# Patient Record
Sex: Female | Born: 1960 | Race: White | Hispanic: Yes | Marital: Married | Smoking: Never smoker
Health system: Southern US, Community
[De-identification: ages and names within clinical notes are randomized; demographics above are authoritative.]

## PROBLEM LIST (undated history)

## (undated) DIAGNOSIS — E785 Hyperlipidemia, unspecified: Secondary | ICD-10-CM

## (undated) HISTORY — PX: TUBAL LIGATION: SHX77

## (undated) HISTORY — DX: Hyperlipidemia, unspecified: E78.5

---

## 2003-01-05 ENCOUNTER — Ambulatory Visit (HOSPITAL_COMMUNITY): Admission: RE | Admit: 2003-01-05 | Discharge: 2003-01-05 | Payer: Self-pay | Admitting: Obstetrics & Gynecology

## 2003-01-05 ENCOUNTER — Encounter: Payer: Self-pay | Admitting: Obstetrics & Gynecology

## 2004-02-11 ENCOUNTER — Ambulatory Visit (HOSPITAL_COMMUNITY): Admission: RE | Admit: 2004-02-11 | Discharge: 2004-02-11 | Payer: Self-pay | Admitting: Obstetrics & Gynecology

## 2005-02-16 ENCOUNTER — Ambulatory Visit (HOSPITAL_COMMUNITY): Admission: RE | Admit: 2005-02-16 | Discharge: 2005-02-16 | Payer: Self-pay | Admitting: Family Medicine

## 2005-03-09 ENCOUNTER — Ambulatory Visit (HOSPITAL_COMMUNITY): Admission: RE | Admit: 2005-03-09 | Discharge: 2005-03-09 | Payer: Self-pay | Admitting: Obstetrics

## 2006-02-19 ENCOUNTER — Ambulatory Visit (HOSPITAL_COMMUNITY): Admission: RE | Admit: 2006-02-19 | Discharge: 2006-02-19 | Payer: Self-pay | Admitting: Family Medicine

## 2007-04-30 ENCOUNTER — Ambulatory Visit (HOSPITAL_COMMUNITY): Admission: RE | Admit: 2007-04-30 | Discharge: 2007-04-30 | Payer: Self-pay | Admitting: Family Medicine

## 2008-05-05 ENCOUNTER — Ambulatory Visit (HOSPITAL_COMMUNITY): Admission: RE | Admit: 2008-05-05 | Discharge: 2008-05-05 | Payer: Self-pay | Admitting: Family Medicine

## 2008-06-04 ENCOUNTER — Ambulatory Visit: Payer: Self-pay

## 2009-05-30 ENCOUNTER — Ambulatory Visit (HOSPITAL_COMMUNITY): Admission: RE | Admit: 2009-05-30 | Discharge: 2009-05-30 | Payer: Self-pay | Admitting: Family Medicine

## 2010-05-31 ENCOUNTER — Ambulatory Visit (HOSPITAL_COMMUNITY)
Admission: RE | Admit: 2010-05-31 | Discharge: 2010-05-31 | Payer: Self-pay | Source: Home / Self Care | Attending: Family Medicine | Admitting: Family Medicine

## 2010-07-09 ENCOUNTER — Encounter: Payer: Self-pay | Admitting: Family Medicine

## 2011-05-29 ENCOUNTER — Other Ambulatory Visit (HOSPITAL_COMMUNITY): Payer: Self-pay | Admitting: Family Medicine

## 2011-05-29 DIAGNOSIS — Z1231 Encounter for screening mammogram for malignant neoplasm of breast: Secondary | ICD-10-CM

## 2011-07-03 ENCOUNTER — Ambulatory Visit (HOSPITAL_COMMUNITY)
Admission: RE | Admit: 2011-07-03 | Discharge: 2011-07-03 | Disposition: A | Discharge status: Home / Self Care | Source: Ambulatory Visit | Attending: Family Medicine | Admitting: Family Medicine

## 2011-07-03 DIAGNOSIS — Z1231 Encounter for screening mammogram for malignant neoplasm of breast: Secondary | ICD-10-CM | POA: Insufficient documentation

## 2011-07-10 ENCOUNTER — Other Ambulatory Visit: Payer: Self-pay | Admitting: Family Medicine

## 2011-07-10 DIAGNOSIS — R928 Other abnormal and inconclusive findings on diagnostic imaging of breast: Secondary | ICD-10-CM

## 2011-07-30 ENCOUNTER — Ambulatory Visit
Admission: RE | Admit: 2011-07-30 | Discharge: 2011-07-30 | Disposition: A | Discharge status: Home / Self Care | Source: Ambulatory Visit | Attending: Family Medicine | Admitting: Family Medicine

## 2011-07-30 DIAGNOSIS — R928 Other abnormal and inconclusive findings on diagnostic imaging of breast: Secondary | ICD-10-CM

## 2012-11-27 ENCOUNTER — Other Ambulatory Visit (HOSPITAL_COMMUNITY): Payer: Self-pay | Admitting: Family Medicine

## 2012-11-27 DIAGNOSIS — Z1231 Encounter for screening mammogram for malignant neoplasm of breast: Secondary | ICD-10-CM

## 2012-12-05 ENCOUNTER — Ambulatory Visit (HOSPITAL_COMMUNITY)
Admission: RE | Admit: 2012-12-05 | Discharge: 2012-12-05 | Disposition: A | Discharge status: Home / Self Care | Source: Ambulatory Visit | Attending: Family Medicine | Admitting: Family Medicine

## 2012-12-05 DIAGNOSIS — Z1231 Encounter for screening mammogram for malignant neoplasm of breast: Secondary | ICD-10-CM | POA: Insufficient documentation

## 2013-08-04 ENCOUNTER — Ambulatory Visit: Payer: Self-pay | Admitting: Obstetrics

## 2013-08-27 ENCOUNTER — Ambulatory Visit (INDEPENDENT_AMBULATORY_CARE_PROVIDER_SITE_OTHER): Admitting: Obstetrics

## 2013-08-27 ENCOUNTER — Encounter: Payer: Self-pay | Admitting: Obstetrics

## 2013-08-27 VITALS — BP 107/72 | HR 78 | Temp 98.5°F | Ht 61.0 in | Wt 149.0 lb

## 2013-08-27 DIAGNOSIS — Z01419 Encounter for gynecological examination (general) (routine) without abnormal findings: Secondary | ICD-10-CM

## 2013-08-27 DIAGNOSIS — Z124 Encounter for screening for malignant neoplasm of cervix: Secondary | ICD-10-CM

## 2013-08-27 DIAGNOSIS — N951 Menopausal and female climacteric states: Secondary | ICD-10-CM

## 2013-08-27 DIAGNOSIS — Z113 Encounter for screening for infections with a predominantly sexual mode of transmission: Secondary | ICD-10-CM

## 2013-08-27 NOTE — Progress Notes (Signed)
Subjective:     Melody Berger is a 53 y.o. female here for a routine exam.  Current complaints:Patient is in the office today for an annual exam. Patient states her last cycle was in December, Patient states it only lasted for 3 days, that the first day it was really heavy and then just drips then next 2 days. Patient states she thinks she is going through menopause. Patient states that each month she has breast tenderness. Patient states sometimes she cant sleep and that she gets headaches.   Personal health questionnaire reviewed: yes.   Gynecologic History Patient's last menstrual period was 05/22/2013. Contraception: none Last Pap: 2013. Results were: normal Last mammogram: 2014. Results were: normal ( Patient states they told her she has thick tissue)  Obstetric History OB History  No data available     The following portions of the patient's history were reviewed and updated as appropriate: allergies, current medications, past family history, past medical history, past social history, past surgical history and problem list.  Review of Systems Pertinent items are noted in HPI.    Objective:    General appearance: alert and no distress Breasts: normal appearance, no masses or tenderness Abdomen: normal findings: soft, non-tender Pelvic: cervix normal in appearance, external genitalia normal, no adnexal masses or tenderness, no cervical motion tenderness, rectovaginal septum normal, uterus normal size, shape, and consistency and vagina normal without discharge    Assessment:    Healthy female exam.   Perimenopause   Plan:    Education reviewed: Perimenopause. Follow up in: 1 year.

## 2013-08-28 LAB — WET PREP BY MOLECULAR PROBE
Candida species: NEGATIVE
Gardnerella vaginalis: NEGATIVE
Trichomonas vaginosis: NEGATIVE

## 2013-08-28 LAB — PAP IG W/ RFLX HPV ASCU

## 2014-02-15 ENCOUNTER — Other Ambulatory Visit (HOSPITAL_COMMUNITY): Payer: Self-pay | Admitting: Family Medicine

## 2014-02-15 DIAGNOSIS — Z1231 Encounter for screening mammogram for malignant neoplasm of breast: Secondary | ICD-10-CM

## 2014-02-18 ENCOUNTER — Ambulatory Visit (HOSPITAL_COMMUNITY)
Admission: RE | Admit: 2014-02-18 | Discharge: 2014-02-18 | Disposition: A | Discharge status: Home / Self Care | Source: Ambulatory Visit | Attending: Family Medicine | Admitting: Family Medicine

## 2014-02-18 DIAGNOSIS — Z1231 Encounter for screening mammogram for malignant neoplasm of breast: Secondary | ICD-10-CM | POA: Insufficient documentation

## 2014-04-19 ENCOUNTER — Encounter: Payer: Self-pay | Admitting: Obstetrics

## 2015-03-04 ENCOUNTER — Other Ambulatory Visit (HOSPITAL_COMMUNITY): Payer: Self-pay | Admitting: Internal Medicine

## 2015-03-04 DIAGNOSIS — Z1231 Encounter for screening mammogram for malignant neoplasm of breast: Secondary | ICD-10-CM

## 2015-03-09 ENCOUNTER — Ambulatory Visit (HOSPITAL_COMMUNITY)
Admission: RE | Admit: 2015-03-09 | Discharge: 2015-03-09 | Disposition: A | Discharge status: Home / Self Care | Source: Ambulatory Visit | Attending: Internal Medicine | Admitting: Internal Medicine

## 2015-03-09 DIAGNOSIS — Z1231 Encounter for screening mammogram for malignant neoplasm of breast: Secondary | ICD-10-CM | POA: Diagnosis not present

## 2015-03-17 IMAGING — MG MM DIGITAL SCREENING BILAT W/ CAD
4 series · 4 of 4 positions shown · non-contrast
Comparison: Previous exams.

CLINICAL DATA: Screening.

DIGITAL SCREENING BILATERAL MAMMOGRAM WITH CAD

[R CC]
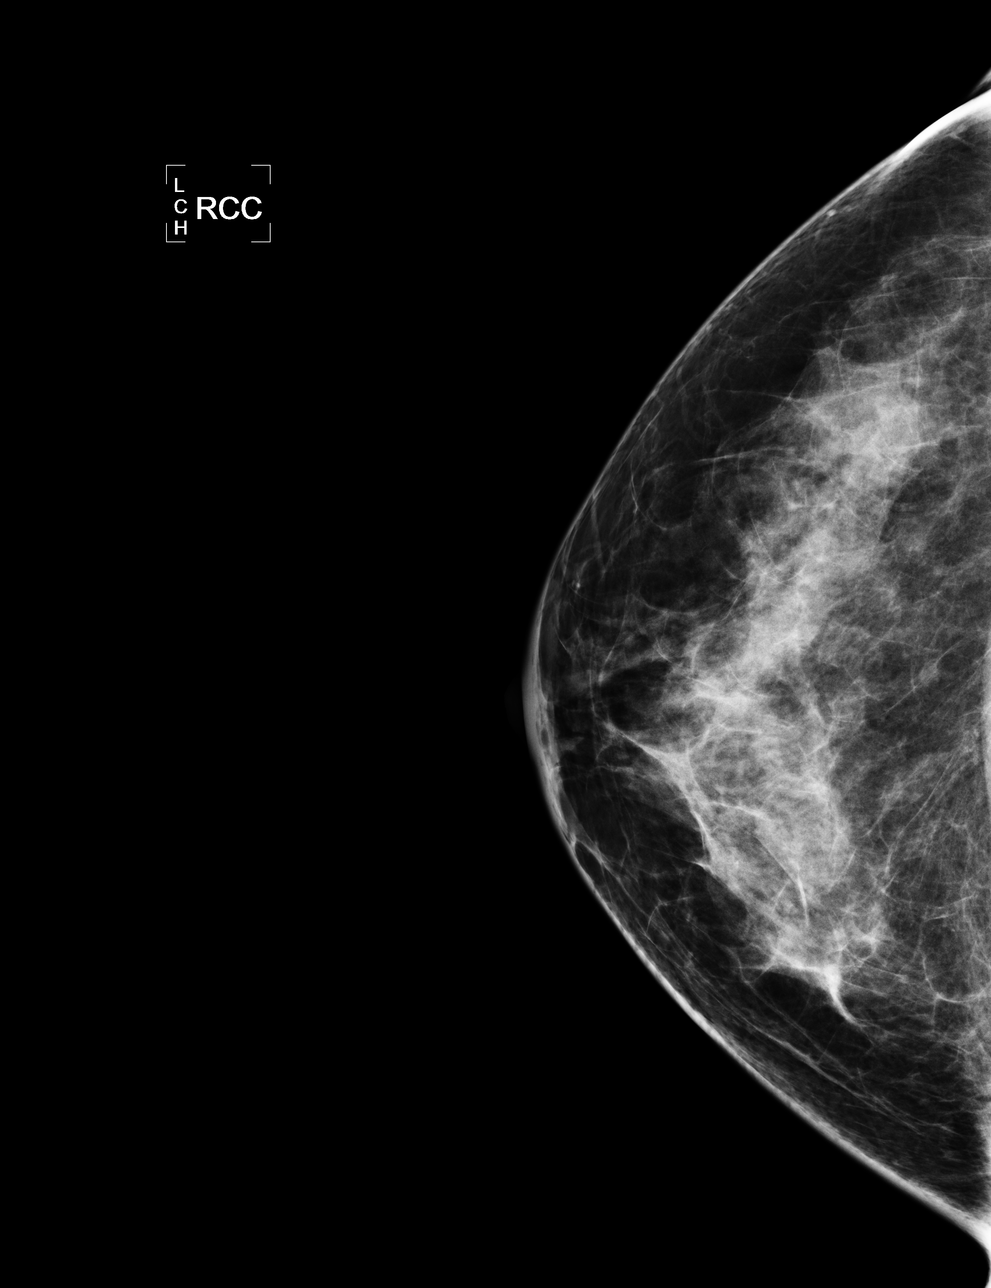

[R MLO]
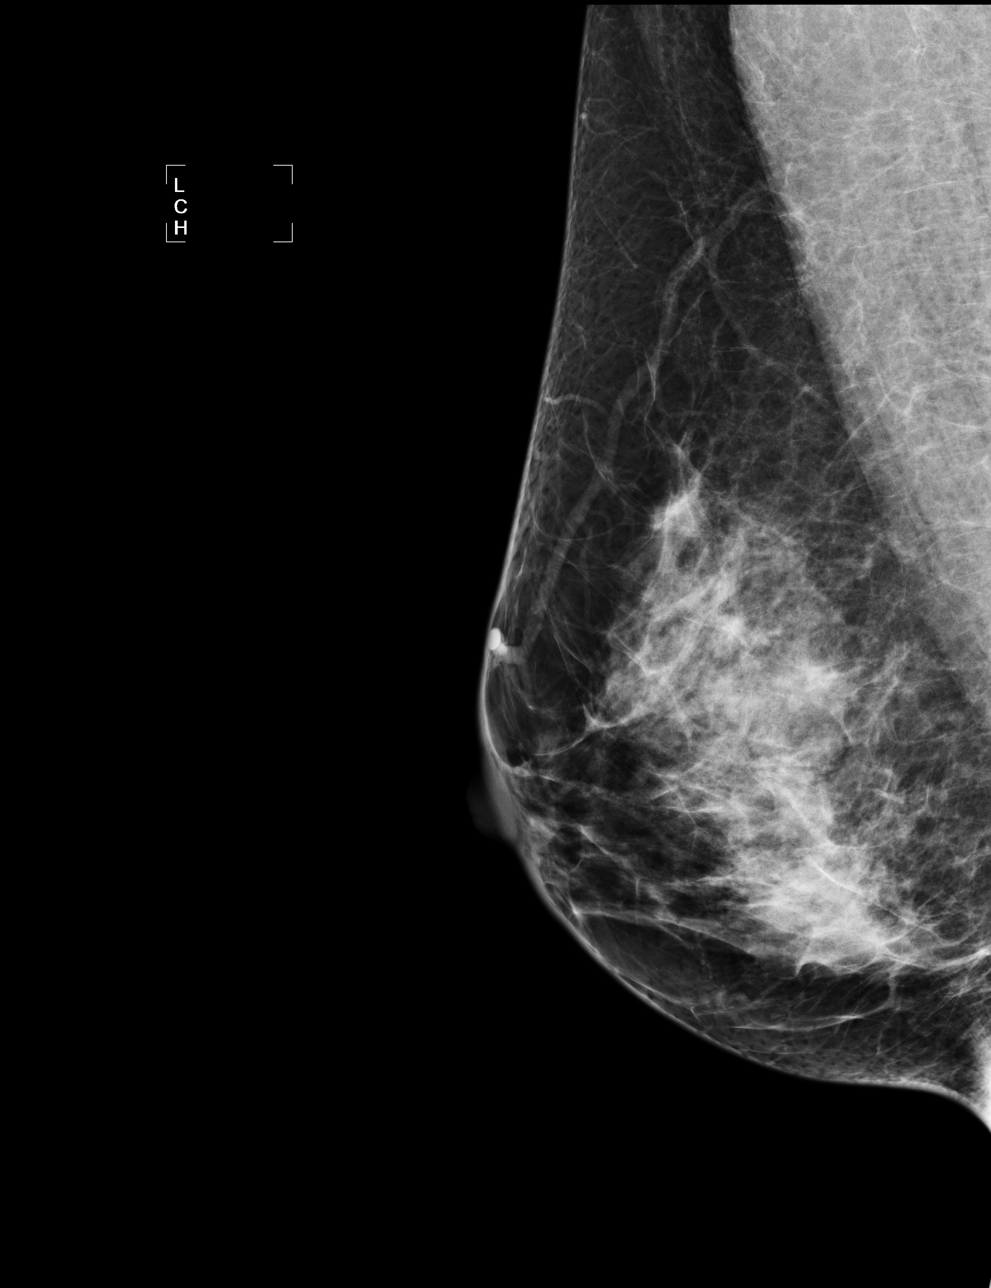

[L CC]
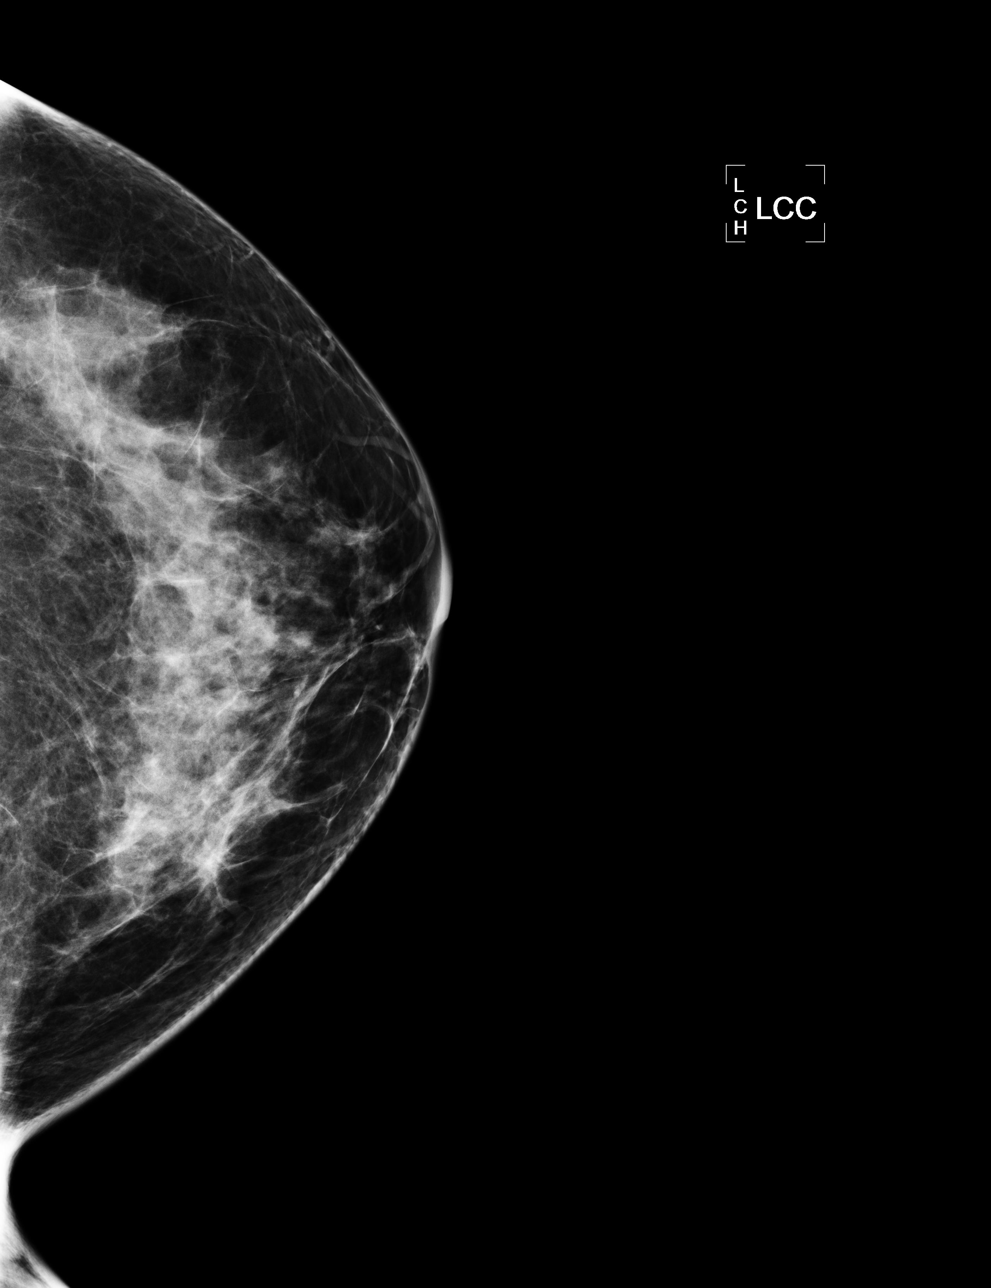

[L MLO]
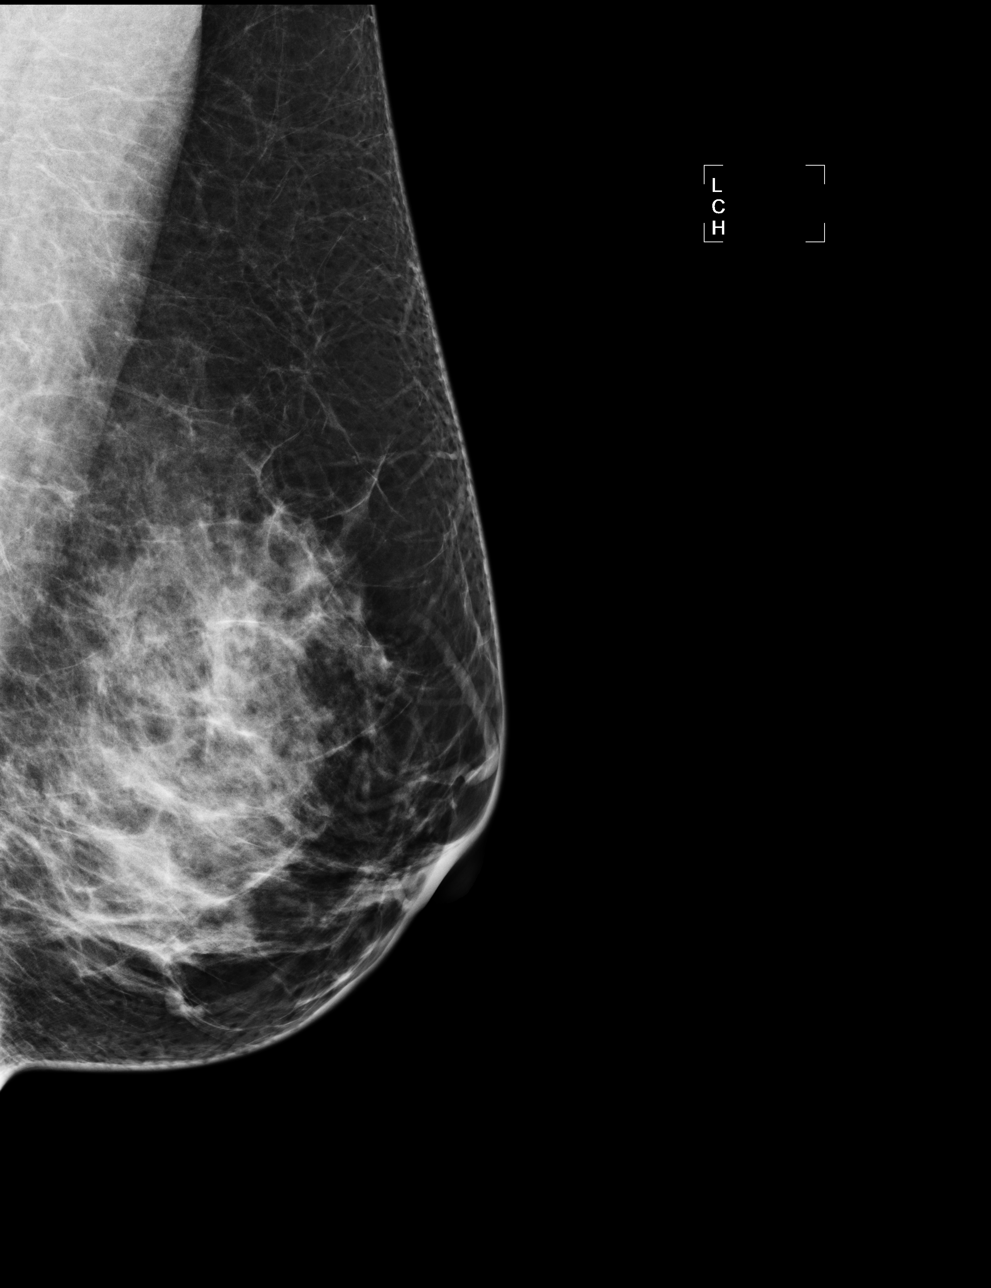

[4 of 4 positions shown; findings below may reference images not displayed]

FINDINGS: ACR Breast Density Category c: The breast tissue is heterogeneously
dense.

There are no findings suspicious for malignancy.

Images were processed with CAD.
IMPRESSION: No mammographic evidence of malignancy.

A result letter of this screening mammogram will be mailed directly
to the patient.

RECOMMENDATION:
Screening mammogram in one year. (Code:BI-I-WJL)

BI-RADS CATEGORY 1:  Negative.

## 2016-03-15 ENCOUNTER — Other Ambulatory Visit: Payer: Self-pay | Admitting: Internal Medicine

## 2016-03-15 DIAGNOSIS — Z1231 Encounter for screening mammogram for malignant neoplasm of breast: Secondary | ICD-10-CM

## 2016-03-22 ENCOUNTER — Other Ambulatory Visit: Payer: Self-pay | Admitting: Internal Medicine

## 2016-03-22 DIAGNOSIS — M8589 Other specified disorders of bone density and structure, multiple sites: Secondary | ICD-10-CM

## 2016-03-27 ENCOUNTER — Ambulatory Visit
Admission: RE | Admit: 2016-03-27 | Discharge: 2016-03-27 | Disposition: A | Discharge status: Home / Self Care | Source: Ambulatory Visit | Attending: Internal Medicine | Admitting: Internal Medicine

## 2016-03-27 DIAGNOSIS — Z1231 Encounter for screening mammogram for malignant neoplasm of breast: Secondary | ICD-10-CM

## 2016-04-06 ENCOUNTER — Ambulatory Visit
Admission: RE | Admit: 2016-04-06 | Discharge: 2016-04-06 | Disposition: A | Discharge status: Home / Self Care | Source: Ambulatory Visit | Attending: Internal Medicine | Admitting: Internal Medicine

## 2016-04-06 DIAGNOSIS — M8589 Other specified disorders of bone density and structure, multiple sites: Secondary | ICD-10-CM

## 2017-08-28 ENCOUNTER — Other Ambulatory Visit: Payer: Self-pay | Admitting: Medical

## 2017-08-28 DIAGNOSIS — N644 Mastodynia: Secondary | ICD-10-CM

## 2017-09-06 ENCOUNTER — Ambulatory Visit
Admission: RE | Admit: 2017-09-06 | Discharge: 2017-09-06 | Disposition: A | Discharge status: Home / Self Care | Source: Ambulatory Visit | Attending: Medical | Admitting: Medical

## 2017-09-06 ENCOUNTER — Ambulatory Visit

## 2017-09-06 DIAGNOSIS — N644 Mastodynia: Secondary | ICD-10-CM

## 2018-08-19 ENCOUNTER — Other Ambulatory Visit: Payer: Self-pay | Admitting: Medical

## 2018-08-19 DIAGNOSIS — Z1231 Encounter for screening mammogram for malignant neoplasm of breast: Secondary | ICD-10-CM

## 2018-08-29 ENCOUNTER — Ambulatory Visit: Admitting: Cardiology

## 2018-09-19 ENCOUNTER — Ambulatory Visit

## 2018-11-07 ENCOUNTER — Ambulatory Visit
Admission: RE | Admit: 2018-11-07 | Discharge: 2018-11-07 | Disposition: A | Discharge status: Home / Self Care | Source: Ambulatory Visit | Attending: Medical | Admitting: Medical

## 2018-11-07 ENCOUNTER — Other Ambulatory Visit: Payer: Self-pay

## 2018-11-07 DIAGNOSIS — Z1231 Encounter for screening mammogram for malignant neoplasm of breast: Secondary | ICD-10-CM

## 2019-08-12 ENCOUNTER — Encounter: Payer: Self-pay | Admitting: Physician Assistant

## 2019-08-12 ENCOUNTER — Ambulatory Visit (INDEPENDENT_AMBULATORY_CARE_PROVIDER_SITE_OTHER): Admitting: Physician Assistant

## 2019-08-12 ENCOUNTER — Other Ambulatory Visit: Payer: Self-pay

## 2019-08-12 VITALS — BP 122/80 | HR 62 | Temp 98.0°F | Resp 16 | Ht 61.0 in | Wt 145.0 lb

## 2019-08-12 DIAGNOSIS — Z23 Encounter for immunization: Secondary | ICD-10-CM | POA: Diagnosis not present

## 2019-08-12 DIAGNOSIS — E782 Mixed hyperlipidemia: Secondary | ICD-10-CM | POA: Insufficient documentation

## 2019-08-12 MED ORDER — TETANUS-DIPHTH-ACELL PERTUSSIS 5-2.5-18.5 LF-MCG/0.5 IM SUSP: 0.5000 mL | Freq: Once | INTRAMUSCULAR | 0 refills | Status: DC

## 2019-08-12 NOTE — Assessment & Plan Note (Signed)
Continue current meds as directed labwork pending 

## 2019-08-12 NOTE — Progress Notes (Signed)
Acute Office Visit  Subjective:    Patient ID: Melody Berger, female    DOB: 1961/03/25, 59 y.o.   MRN: [id number]  Chief Complaint  Patient presents with  . Follow-up  . Hyperlipidemia    HPI Patient is in today for follow up hyperlipidemia - she is currently on rosuvastatin 5mg  qd and fish oil qd - she tries to watch her diet Mixed hyperlipidemia  Pt presents with hyperlipidemia.  Compliance with treatment has been good ; The patient is compliant with medications, maintains a low cholesterol diet , follows up as directed , and maintains an exercise regimen . The patient denies experiencing any hypercholesterolemia related symptoms.  Evidenced based information based on history , exam, and other sources has been used  for decision making.  Past Medical History:  Diagnosis Date  . Hyperlipemia     Past Surgical History:  Procedure Laterality Date  . TUBAL LIGATION      Family History  Problem Relation Age of Onset  . Osteoporosis Mother   . Cancer Father   . Breast cancer Cousin        in 35's or 52's    Social History   Socioeconomic History  . Marital status: Married    Spouse name: Not on file  . Number of children: Not on file  . Years of education: Not on file  . Highest education level: Not on file  Occupational History  . Occupation: unemployed  Tobacco Use  . Smoking status: Never Smoker  . Smokeless tobacco: Never Used  Substance and Sexual Activity  . Alcohol use: No  . Drug use: No  . Sexual activity: Not Currently    Birth control/protection: Surgical    Comment: Tubal Ligation   Other Topics Concern  . Not on file  Social History Narrative  . Not on file   Social Determinants of Health   Financial Resource Strain:   . Difficulty of Paying Living Expenses: Not on file  Food Insecurity:   . Worried About 59's in the Last Year: Not on file  . Ran Out of Food in the Last Year: Not on file  Transportation Needs:   . Lack of  Transportation (Medical): Not on file  . Lack of Transportation (Non-Medical): Not on file  Physical Activity:   . Days of Exercise per Week: Not on file  . Minutes of Exercise per Session: Not on file  Stress:   . Feeling of Stress : Not on file  Social Connections:   . Frequency of Communication with Friends and Family: Not on file  . Frequency of Social Gatherings with Friends and Family: Not on file  . Attends Religious Services: Not on file  . Active Member of Clubs or Organizations: Not on file  . Attends Programme researcher, broadcasting/film/video Meetings: Not on file  . Marital Status: Not on file  Intimate Partner Violence:   . Fear of Current or Ex-Partner: Not on file  . Emotionally Abused: Not on file  . Physically Abused: Not on file  . Sexually Abused: Not on file     Current Outpatient Medications:  .  cetirizine (ZYRTEC) 10 MG tablet, Take 10 mg by mouth daily., Disp: , Rfl:  .  Multiple Vitamins-Minerals (MULTIVITAMIN PO), Take by mouth., Disp: , Rfl:  .  rosuvastatin (CRESTOR) 5 MG tablet, Take 5 mg by mouth daily., Disp: , Rfl:    Allergies  Allergen Reactions  . Terbinafine And Related  CONSTITUTIONAL: Negative for chills, fatigue, fever, unintentional weight gain and unintentional weight loss.  E/N/T: Negative for ear pain, nasal congestion and sore throat.  CARDIOVASCULAR: Negative for chest pain, dizziness, palpitations and pedal edema.  RESPIRATORY: Negative for recent cough and dyspnea.  GASTROINTESTINAL: Negative for abdominal pain, acid reflux symptoms, constipation, diarrhea, nausea and vomiting.  MSK: Negative for arthralgias and myalgias.  INTEGUMENTARY: Negative for rash.  NEUROLOGICAL: Negative for dizziness and headaches.  PSYCHIATRIC: Negative for sleep disturbance and to question depression screen.  Negative for depression, negative for anhedonia.         Objective:    PHYSICAL EXAM:   VS: BP 122/80   Pulse 62   Temp 98 F (36.7 C)   Resp 16   Ht  5\' 1"  (1.549 m)   Wt 145 lb (65.8 kg)   LMP 05/22/2013   SpO2 99%   BMI 27.40 kg/m   GEN: Well nourished, well developed, in no acute distress   Cardiac: RRR; no murmurs, rubs, or gallops,no edema - no significant varicosities Respiratory:  normal respiratory rate and pattern with no distress - normal breath sounds with no rales, rhonchi, wheezes or rubs  Neuro:  Alert and Oriented x 3, Strength and sensation are intact - CN II-Xii grossly intact Psych: euthymic mood, appropriate affect and demeanor   Wt Readings from Last 3 Encounters:  08/12/19 145 lb (65.8 kg)  08/27/13 149 lb (67.6 kg)    Health Maintenance Due  Topic Date Due  . Hepatitis C Screening  1961/02/25  . HIV Screening  01/28/1976  . PAP SMEAR-Modifier  08/27/2016    There are no preventive care reminders to display for this patient.        Assessment & Plan:   Problem List Items Addressed This Visit      Other   Mixed hyperlipidemia - Primary    Continue current meds as directed labwork pending      Relevant Medications   rosuvastatin (CRESTOR) 5 MG tablet   Other Relevant Orders   CBC with Differential/Platelet   Comprehensive metabolic panel   Lipid panel   Need for prophylactic vaccination with combined diphtheria-tetanus-pertussis (DTP) vaccine   Relevant Orders   Tdap vaccine greater than or equal to 7yo IM (Completed)       Meds ordered this encounter  Medications  . DISCONTD: Tdap (BOOSTRIX) 5-2.5-18.5 LF-MCG/0.5 injection    Sig: Inject 0.5 mLs into the muscle once for 1 dose.    Dispense:  0.5 mL    Refill:  0    Order Specific Question:   Supervising Provider    Answer:   COX, KIRSTEN [[id number]]     [place], PA-C

## 2019-08-13 LAB — LIPID PANEL
Chol/HDL Ratio: 3.3 ratio (ref 0.0–4.4)
Cholesterol, Total: 176 mg/dL (ref 100–199)
HDL: 54 mg/dL (ref >39)
LDL Chol Calc (NIH): 98 mg/dL (ref 0–99)
Triglycerides: 136 mg/dL (ref 0–149)
VLDL Cholesterol Cal: 24 mg/dL (ref 5–40)

## 2019-08-13 LAB — CBC WITH DIFFERENTIAL/PLATELET
Basophils Absolute: 0 10*3/uL (ref 0.0–0.2)
Basos: 0 %
EOS (ABSOLUTE): 0.1 10*3/uL (ref 0.0–0.4)
Eos: 2 %
Hematocrit: 45.8 % (ref 34.0–46.6)
Hemoglobin: 15 g/dL (ref 11.1–15.9)
Immature Grans (Abs): 0 10*3/uL (ref 0.0–0.1)
Immature Granulocytes: 0 %
Lymphocytes Absolute: 1.9 10*3/uL (ref 0.7–3.1)
Lymphs: 33 %
MCH: 28.8 pg (ref 26.6–33.0)
MCHC: 32.8 g/dL (ref 31.5–35.7)
MCV: 88 fL (ref 79–97)
Monocytes Absolute: 0.4 10*3/uL (ref 0.1–0.9)
Monocytes: 7 %
Neutrophils Absolute: 3.3 10*3/uL (ref 1.4–7.0)
Neutrophils: 58 %
Platelets: 155 10*3/uL (ref 150–450)
RBC: 5.21 x10E6/uL (ref 3.77–5.28)
RDW: 12.8 % (ref 11.7–15.4)
WBC: 5.7 10*3/uL (ref 3.4–10.8)

## 2019-08-13 LAB — COMPREHENSIVE METABOLIC PANEL
ALT: 25 IU/L (ref 0–32)
AST: 23 IU/L (ref 0–40)
Albumin/Globulin Ratio: 1.6 (ref 1.2–2.2)
Albumin: 4.2 g/dL (ref 3.8–4.9)
Alkaline Phosphatase: 92 IU/L (ref 39–117)
BUN/Creatinine Ratio: 18 (ref 9–23)
BUN: 13 mg/dL (ref 6–24)
Bilirubin Total: 0.4 mg/dL (ref 0.0–1.2)
CO2: 24 mmol/L (ref 20–29)
Calcium: 9.3 mg/dL (ref 8.7–10.2)
Chloride: 106 mmol/L (ref 96–106)
Creatinine, Ser: 0.73 mg/dL (ref 0.57–1.00)
GFR calc Af Amer: 105 mL/min/{1.73_m2} (ref >59)
GFR calc non Af Amer: 91 mL/min/{1.73_m2} (ref >59)
Globulin, Total: 2.6 g/dL (ref 1.5–4.5)
Glucose: 85 mg/dL (ref 65–99)
Potassium: 4.5 mmol/L (ref 3.5–5.2)
Sodium: 144 mmol/L (ref 134–144)
Total Protein: 6.8 g/dL (ref 6.0–8.5)

## 2019-08-13 LAB — CARDIOVASCULAR RISK ASSESSMENT

## 2019-10-05 ENCOUNTER — Other Ambulatory Visit: Payer: Self-pay | Admitting: Family Medicine

## 2019-11-02 ENCOUNTER — Other Ambulatory Visit: Payer: Self-pay | Admitting: Physician Assistant

## 2019-11-02 DIAGNOSIS — Z1231 Encounter for screening mammogram for malignant neoplasm of breast: Secondary | ICD-10-CM

## 2019-11-19 ENCOUNTER — Ambulatory Visit

## 2019-11-19 ENCOUNTER — Ambulatory Visit
Admission: RE | Admit: 2019-11-19 | Discharge: 2019-11-19 | Disposition: A | Discharge status: Home / Self Care | Source: Ambulatory Visit | Attending: Physician Assistant | Admitting: Physician Assistant

## 2019-11-19 ENCOUNTER — Other Ambulatory Visit: Payer: Self-pay

## 2019-11-19 DIAGNOSIS — Z1231 Encounter for screening mammogram for malignant neoplasm of breast: Secondary | ICD-10-CM

## 2020-02-09 ENCOUNTER — Encounter: Payer: Self-pay | Admitting: Physician Assistant

## 2020-02-09 ENCOUNTER — Ambulatory Visit (INDEPENDENT_AMBULATORY_CARE_PROVIDER_SITE_OTHER): Admitting: Physician Assistant

## 2020-02-09 ENCOUNTER — Other Ambulatory Visit: Payer: Self-pay

## 2020-02-09 VITALS — BP 116/68 | HR 64 | Temp 97.8°F | Ht 61.0 in | Wt 146.0 lb

## 2020-02-09 DIAGNOSIS — M546 Pain in thoracic spine: Secondary | ICD-10-CM

## 2020-02-09 DIAGNOSIS — E782 Mixed hyperlipidemia: Secondary | ICD-10-CM | POA: Diagnosis not present

## 2020-02-09 MED ORDER — CYCLOBENZAPRINE HCL 5 MG PO TABS: 5.0000 mg | ORAL_TABLET | Freq: Every day | ORAL | 0 refills | Status: DC

## 2020-02-09 MED ORDER — ROSUVASTATIN CALCIUM 5 MG PO TABS: 5.0000 mg | ORAL_TABLET | Freq: Every day | ORAL | 1 refills | Status: DC

## 2020-02-09 NOTE — Assessment & Plan Note (Signed)
rom exercises Ice therapy Continue aleve - rx for flexeril

## 2020-02-09 NOTE — Patient Instructions (Signed)

## 2020-02-09 NOTE — Assessment & Plan Note (Signed)
Well controlled.  ?No changes to medicines.  ?Continue to work on eating a healthy diet and exercise.  ?Labs drawn today.  ?

## 2020-02-09 NOTE — Progress Notes (Signed)
Acute Office Visit  Subjective:    Patient ID: Melody Berger, female    DOB: 07/03/1960, 59 y.o.   MRN: [id number]  Chief Complaint  Patient presents with   Hyperlipidemia    6 months fasting    HPI Patient is in today for follow up hyperlipidemia - she is currently on rosuvastatin 5mg  qd and fish oil qd - she tries to watch her diet ; The patient is compliant with medications, maintains a low cholesterol diet , follows up as directed , and maintains an exercise regimen . The patient denies experiencing any hypercholesterolemia related symptoms.  Evidenced based information based on history , exam, and other sources has been used  for decision making.  Pt states for a few weeks she has had mid back pain - on both sides thoracic spine Hurts with bending and moving certain ways - cannot recall specific injury or trauma No radiation of pain  Past Medical History:  Diagnosis Date   Hyperlipemia     Past Surgical History:  Procedure Laterality Date   TUBAL LIGATION      Family History  Problem Relation Age of Onset   Osteoporosis Mother    Cancer Father    Breast cancer Cousin        in 47's or 77's    Social History   Socioeconomic History   Marital status: Married    Spouse name: Not on file   Number of children: Not on file   Years of education: Not on file   Highest education level: Not on file  Occupational History   Occupation: unemployed  Tobacco Use   Smoking status: Never Smoker   Smokeless tobacco: Never Used  59's Use: Never used  Substance and Sexual Activity   Alcohol use: No   Drug use: No   Sexual activity: Not Currently    Birth control/protection: Surgical    Comment: Tubal Ligation   Other Topics Concern   Not on file  Social History Narrative   Not on file   Social Determinants of Health   Financial Resource Strain:    Difficulty of Paying Living Expenses: Not on file  Food Insecurity:    Worried  About Building services engineer in the Last Year: Not on file   Programme researcher, broadcasting/film/video of Food in the Last Year: Not on file  Transportation Needs:    Lack of Transportation (Medical): Not on file   Lack of Transportation (Non-Medical): Not on file  Physical Activity:    Days of Exercise per Week: Not on file   Minutes of Exercise per Session: Not on file  Stress:    Feeling of Stress : Not on file  Social Connections:    Frequency of Communication with Friends and Family: Not on file   Frequency of Social Gatherings with Friends and Family: Not on file   Attends Religious Services: Not on file   Active Member of Clubs or Organizations: Not on file   Attends [employer] Financial Meetings: Not on file   Marital Status: Not on file  Intimate Partner Violence:    Fear of Current or Ex-Partner: Not on file   Emotionally Abused: Not on file   Physically Abused: Not on file   Sexually Abused: Not on file     Current Outpatient Medications:    cetirizine (ZYRTEC) 10 MG tablet, Take 10 mg by mouth daily., Disp: , Rfl:    Multiple Vitamins-Minerals (MULTIVITAMIN PO), Take by  mouth., Disp: , Rfl:    rosuvastatin (CRESTOR) 5 MG tablet, Take 1 tablet (5 mg total) by mouth daily., Disp: 90 tablet, Rfl: 1   cyclobenzaprine (FLEXERIL) 5 MG tablet, Take 1 tablet (5 mg total) by mouth at bedtime., Disp: 30 tablet, Rfl: 0   Allergies  Allergen Reactions   Terbinafine And Related     CONSTITUTIONAL: Negative for chills, fatigue, fever, unintentional weight gain and unintentional weight loss.  CARDIOVASCULAR: Negative for chest pain, dizziness, palpitations and pedal edema.  RESPIRATORY: Negative for recent cough and dyspnea.  GASTROINTESTINAL: Negative for abdominal pain, acid reflux symptoms, constipation, diarrhea, nausea and vomiting.  MSK: see HPI PSYCHIATRIC: Negative for sleep disturbance and to question depression screen.  Negative for depression, negative for anhedonia.          Objective:    PHYSICAL EXAM:   VS: BP 116/68 (BP Location: Left Arm, Patient Position: Sitting)    Pulse 64    Temp 97.8 F (36.6 C) (Temporal)    Ht 5\' 1"  (1.549 m)    Wt 146 lb (66.2 kg)    LMP 05/22/2013    SpO2 100%    BMI 27.59 kg/m   GEN: Well nourished, well developed, in no acute distress   Cardiac: RRR; no murmurs, rubs, or gallops,no edema - no significant varicosities Respiratory:  normal respiratory rate and pattern with no distress - normal breath sounds with no rales, rhonchi, wheezes or rubs Musc - tender to thoracic spine to palpation -  Psych: euthymic mood, appropriate affect and demeanor   Wt Readings from Last 3 Encounters:  02/09/20 146 lb (66.2 kg)  08/12/19 145 lb (65.8 kg)  08/27/13 149 lb (67.6 kg)    Health Maintenance Due  Topic Date Due   Hepatitis C Screening  Never done   HIV Screening  Never done   PAP SMEAR-Modifier  08/27/2016   INFLUENZA VACCINE  01/17/2020    There are no preventive care reminders to display for this patient.        Assessment & Plan:   Problem List Items Addressed This Visit      Other   Mixed hyperlipidemia - Primary    Well controlled.  No changes to medicines.  Continue to work on eating a healthy diet and exercise.  Labs drawn today.        Relevant Medications   rosuvastatin (CRESTOR) 5 MG tablet   Other Relevant Orders   Comprehensive metabolic panel   Lipid panel   Acute bilateral thoracic back pain    rom exercises Ice therapy Continue aleve - rx for flexeril      Relevant Medications   cyclobenzaprine (FLEXERIL) 5 MG tablet       Meds ordered this encounter  Medications   rosuvastatin (CRESTOR) 5 MG tablet    Sig: Take 1 tablet (5 mg total) by mouth daily.    Dispense:  90 tablet    Refill:  1    Order Specific Question:   Supervising Provider    Answer:   03/18/2020   cyclobenzaprine (FLEXERIL) 5 MG tablet    Sig: Take 1 tablet (5 mg total) by mouth at bedtime.     Dispense:  30 tablet    Refill:  0    Order Specific Question:   Supervising Provider    Answer:   COX, Corey Harold Fritzi Mandes     SARA R Jomar Denz, PA-C

## 2020-02-10 LAB — COMPREHENSIVE METABOLIC PANEL
ALT: 40 IU/L — ABNORMAL HIGH (ref 0–32)
AST: 37 IU/L (ref 0–40)
Albumin/Globulin Ratio: 1.5 (ref 1.2–2.2)
Albumin: 4.3 g/dL (ref 3.8–4.9)
Alkaline Phosphatase: 99 IU/L (ref 48–121)
BUN/Creatinine Ratio: 26 — ABNORMAL HIGH (ref 9–23)
BUN: 16 mg/dL (ref 6–24)
Bilirubin Total: 0.4 mg/dL (ref 0.0–1.2)
CO2: 25 mmol/L (ref 20–29)
Calcium: 9.8 mg/dL (ref 8.7–10.2)
Chloride: 103 mmol/L (ref 96–106)
Creatinine, Ser: 0.61 mg/dL (ref 0.57–1.00)
GFR calc Af Amer: 115 mL/min/{1.73_m2} (ref >59)
GFR calc non Af Amer: 100 mL/min/{1.73_m2} (ref >59)
Globulin, Total: 2.8 g/dL (ref 1.5–4.5)
Glucose: 87 mg/dL (ref 65–99)
Potassium: 4.5 mmol/L (ref 3.5–5.2)
Sodium: 140 mmol/L (ref 134–144)
Total Protein: 7.1 g/dL (ref 6.0–8.5)

## 2020-02-10 LAB — CARDIOVASCULAR RISK ASSESSMENT

## 2020-02-10 LAB — LIPID PANEL
Chol/HDL Ratio: 3.3 ratio (ref 0.0–4.4)
Cholesterol, Total: 200 mg/dL — ABNORMAL HIGH (ref 100–199)
HDL: 61 mg/dL (ref >39)
LDL Chol Calc (NIH): 110 mg/dL — ABNORMAL HIGH (ref 0–99)
Triglycerides: 166 mg/dL — ABNORMAL HIGH (ref 0–149)
VLDL Cholesterol Cal: 29 mg/dL (ref 5–40)

## 2020-03-24 ENCOUNTER — Ambulatory Visit (INDEPENDENT_AMBULATORY_CARE_PROVIDER_SITE_OTHER): Admitting: Family Medicine

## 2020-03-24 ENCOUNTER — Other Ambulatory Visit: Payer: Self-pay

## 2020-03-24 ENCOUNTER — Encounter: Payer: Self-pay | Admitting: Family Medicine

## 2020-03-24 VITALS — BP 120/78 | HR 68 | Temp 97.9°F | Ht 61.0 in | Wt 145.0 lb

## 2020-03-24 DIAGNOSIS — Z0000 Encounter for general adult medical examination without abnormal findings: Secondary | ICD-10-CM | POA: Diagnosis not present

## 2020-03-24 NOTE — Progress Notes (Signed)
Established Patient Office Visit  Subjective:  Patient ID: Shelli Portilla, female    DOB: [birth year]  Age: 59 y.o. MRN: [id number]  CC:  Chief Complaint  Patient presents with  . 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 Hyperlipemia     Past Surgical History:  Procedure Laterality Date  . TUBAL LIGATION      Family History  Problem Relation Age of Onset  . Osteoporosis Mother   . Cancer Father   . Breast cancer Cousin        in 71's or 50's    Social History   Socioeconomic History  . Marital status: Married    Spouse name: Not on file  . Number of children: Not on file  . Years of education: Not on file  . Highest education level: Not on file  Occupational History  . Occupation: unemployed  Tobacco Use  . Smoking status: Never Smoker  . Smokeless tobacco: Never Used  Vaping Use  . Vaping Use: Never used  Substance and Sexual Activity  . Alcohol use: No  . Drug use: No  . Sexual activity: Not Currently    Birth control/protection: Surgical    Comment: Tubal Ligation   Other Topics Concern  . Not on file  Social History Narrative  . Not on file   Social Determinants of Health   Financial Resource Strain:   . Difficulty of Paying Living Expenses: Not on file  Food Insecurity:   . Worried About Programme researcher, broadcasting/film/video in the Last Year: Not on file  . Ran Out of Food in the Last Year: Not on file  Transportation Needs:   . Lack of Transportation (Medical): Not on file  . Lack of Transportation (Non-Medical): Not on file  Physical Activity:   . Days of Exercise per Week: Not on file  . Minutes of Exercise per Session: Not on file  Stress:   . Feeling of Stress : Not on file  Social  Connections:   . Frequency of Communication with Friends and Family: Not on file  . Frequency of Social Gatherings with Friends and Family: Not on file  . Attends Religious Services: Not on file  . Active Member of Clubs or Organizations: Not on file  . Attends Banker Meetings: Not on file  . Marital Status: Not on file  Intimate Partner Violence:   . Fear of Current or Ex-Partner: Not on file  . Emotionally Abused: Not on file  . Physically Abused: Not on file  . Sexually Abused: Not on file    Outpatient Medications Prior to Visit  Medication Sig Dispense Refill  . cetirizine (ZYRTEC) 10 MG tablet Take 10 mg by mouth daily.    . cyclobenzaprine (FLEXERIL) 5 MG tablet Take 1 tablet (5 mg total) by mouth at bedtime. 30 tablet 0  . Multiple Vitamins-Minerals (MULTIVITAMIN PO) Take by mouth.    . rosuvastatin (CRESTOR) 5 MG tablet Take 1 tablet (5 mg total) by mouth daily. 90 tablet 1   No facility-administered medications prior to visit.    Allergies  Allergen Reactions  . Terbinafine And Related     ROS Review of Systems  Constitutional: Negative.  HENT: Negative.   Eyes: Negative.        Wears glasses  Respiratory: Negative.   Endocrine: Negative.   Genitourinary: Negative.   Musculoskeletal: Negative.   Allergic/Immunologic: Positive for environmental allergies.       Takes zyrtec daily  Neurological: Negative for dizziness, seizures and headaches.  Hematological: Negative.   Psychiatric/Behavioral: Negative.       Objective:    Physical Exam Vitals reviewed.  Constitutional:      Appearance: Normal appearance.  HENT:     Head: Normocephalic.     Right Ear: Tympanic membrane, ear canal and external ear normal.     Left Ear: Tympanic membrane, ear canal and external ear normal.  Cardiovascular:     Rate and Rhythm: Normal rate and regular rhythm.     Pulses: Normal pulses.     Heart sounds: Normal heart sounds.  Pulmonary:     Effort:  Pulmonary effort is normal.     Breath sounds: Normal breath sounds.  Abdominal:     Palpations: Abdomen is soft.  Musculoskeletal:        General: Normal range of motion.     Cervical back: Normal range of motion and neck supple.  Neurological:     Mental Status: She is alert and oriented to person, place, and time.  Psychiatric:        Mood and Affect: Mood normal.        Behavior: Behavior normal.     BP 120/78 (BP Location: Left Arm, Patient Position: Sitting)   Pulse 68   Temp 97.9 F (36.6 C) (Temporal)   Ht 5\' 1"  (1.549 m)   Wt 145 lb (65.8 kg)   LMP 05/22/2013   SpO2 97%   BMI 27.40 kg/m  Wt Readings from Last 3 Encounters:  03/24/20 145 lb (65.8 kg)  02/09/20 146 lb (66.2 kg)  08/12/19 145 lb (65.8 kg)     Health Maintenance Due  Topic Date Due  . Hepatitis C Screening  Never done  . HIV Screening  Never done  . PAP SMEAR-Modifier  08/27/2016  . INFLUENZA VACCINE  01/17/2020     Lab Results  Component Value Date   WBC 5.7 08/12/2019   HGB 15.0 08/12/2019   HCT 45.8 08/12/2019   MCV 88 08/12/2019   PLT 155 08/12/2019   Lab Results  Component Value Date   NA 140 02/09/2020   K 4.5 02/09/2020   CO2 25 02/09/2020   GLUCOSE 87 02/09/2020   BUN 16 02/09/2020   CREATININE 0.61 02/09/2020   BILITOT 0.4 02/09/2020   ALKPHOS 99 02/09/2020   AST 37 02/09/2020   ALT 40 (H) 02/09/2020   PROT 7.1 02/09/2020   ALBUMIN 4.3 02/09/2020   CALCIUM 9.8 02/09/2020   Lab Results  Component Value Date   CHOL 200 (H) 02/09/2020   Lab Results  Component Value Date   HDL 61 02/09/2020   Lab Results  Component Value Date   LDLCALC 110 (H) 02/09/2020   Lab Results  Component Value Date   TRIG 166 (H) 02/09/2020   Lab Results  Component Value Date   CHOLHDL 3.3 02/09/2020      Assessment & Plan:  1. Well adult exam-no concerns Form completed    Matrice Herro 02/11/2020, MD

## 2020-03-24 NOTE — Patient Instructions (Addendum)
Due to day of the week being Thursday we can not place-pt has appointment on Friday at [facility] for PPD to be read on Monday to complete form-keep appointment Flu shot today

## 2020-03-29 DIAGNOSIS — Z0000 Encounter for general adult medical examination without abnormal findings: Secondary | ICD-10-CM | POA: Insufficient documentation

## 2020-06-16 ENCOUNTER — Encounter: Payer: Self-pay | Admitting: Physician Assistant

## 2020-08-16 ENCOUNTER — Other Ambulatory Visit: Payer: Self-pay

## 2020-08-16 ENCOUNTER — Encounter: Payer: Self-pay | Admitting: Physician Assistant

## 2020-08-16 ENCOUNTER — Ambulatory Visit (INDEPENDENT_AMBULATORY_CARE_PROVIDER_SITE_OTHER): Admitting: Physician Assistant

## 2020-08-16 VITALS — BP 114/72 | HR 72 | Temp 97.3°F | Ht 61.0 in | Wt 145.4 lb

## 2020-08-16 DIAGNOSIS — E782 Mixed hyperlipidemia: Secondary | ICD-10-CM | POA: Diagnosis not present

## 2020-08-16 DIAGNOSIS — H6502 Acute serous otitis media, left ear: Secondary | ICD-10-CM | POA: Insufficient documentation

## 2020-08-16 MED ORDER — AMOXICILLIN 875 MG PO TABS: 875.0000 mg | ORAL_TABLET | Freq: Two times a day (BID) | ORAL | 0 refills | Status: DC

## 2020-08-16 NOTE — Progress Notes (Signed)
Acute Office Visit  Subjective:    Patient ID: Melody Berger, female    DOB: 1961/05/12, 60 y.o.   MRN: [id number]  Chief Complaint  Patient presents with  . Hyperlipidemia    46M fasting Ear pain    Hyperlipidemia   Patient is in today for follow up hyperlipidemia - she is currently on rosuvastatin 5mg  qd and fish oil qd - she tries to watch her diet Voices no concerns or problems  Pt complains of left ear pain - says feels stopped up and having some mild pain - denies congestion  Past Medical History:  Diagnosis Date  . Hyperlipemia     Past Surgical History:  Procedure Laterality Date  . TUBAL LIGATION      Family History  Problem Relation Age of Onset  . Osteoporosis Mother   . Cancer Father   . Breast cancer Cousin        in 28's or 2's    Social History   Socioeconomic History  . Marital status: Married    Spouse name: Not on file  . Number of children: Not on file  . Years of education: Not on file  . Highest education level: Not on file  Occupational History  . Occupation: unemployed  Tobacco Use  . Smoking status: Never Smoker  . Smokeless tobacco: Never Used  Vaping Use  . Vaping Use: Never used  Substance and Sexual Activity  . Alcohol use: No  . Drug use: No  . Sexual activity: Not Currently    Birth control/protection: Surgical    Comment: Tubal Ligation   Other Topics Concern  . Not on file  Social History Narrative  . Not on file   Social Determinants of Health   Financial Resource Strain: Not on file  Food Insecurity: Not on file  Transportation Needs: Not on file  Physical Activity: Not on file  Stress: Not on file  Social Connections: Not on file  Intimate Partner Violence: Not on file     Current Outpatient Medications:  .  amoxicillin (AMOXIL) 875 MG tablet, Take 1 tablet (875 mg total) by mouth 2 (two) times daily., Disp: 20 tablet, Rfl: 0 .  cetirizine (ZYRTEC) 10 MG tablet, Take 10 mg by mouth daily., Disp: , Rfl:   .  cyclobenzaprine (FLEXERIL) 5 MG tablet, Take 1 tablet (5 mg total) by mouth at bedtime., Disp: 30 tablet, Rfl: 0 .  Multiple Vitamins-Minerals (MULTIVITAMIN PO), Take by mouth., Disp: , Rfl:  .  rosuvastatin (CRESTOR) 5 MG tablet, Take 1 tablet (5 mg total) by mouth daily., Disp: 90 tablet, Rfl: 1   Allergies  Allergen Reactions  . Terbinafine And Related     CONSTITUTIONAL: Negative for chills, fatigue, fever, unintentional weight gain and unintentional weight loss.  ENT - see HPI CARDIOVASCULAR: Negative for chest pain, dizziness, palpitations and pedal edema.  RESPIRATORY: Negative for recent cough and dyspnea.  GASTROINTESTINAL: Negative for abdominal pain, acid reflux symptoms, constipation, diarrhea, nausea and vomiting.  PSYCHIATRIC: Negative for sleep disturbance and to question depression screen.  Negative for depression, negative for anhedonia.         Objective:    PHYSICAL EXAM:   VS: BP 114/72 (BP Location: Left Arm, Patient Position: Sitting, Cuff Size: Normal)   Pulse 72   Temp (!) 97.3 F (36.3 C) (Temporal)   Ht 5\' 1"  (1.549 m)   Wt 145 lb 6.4 oz (66 kg)   LMP 05/22/2013   SpO2 97%  BMI 27.47 kg/m   PHYSICAL EXAM:   VS: BP 114/72 (BP Location: Left Arm, Patient Position: Sitting, Cuff Size: Normal)   Pulse 72   Temp (!) 97.3 F (36.3 C) (Temporal)   Ht 5\' 1"  (1.549 m)   Wt 145 lb 6.4 oz (66 kg)   LMP 05/22/2013   SpO2 97%   BMI 27.47 kg/m   GEN: Well nourished, well developed, in no acute distress  HEENT: normal external ears and nose - normal external auditory canals Right TM normal - left TM with air fluid level seen Oropharynx - normal mucosa, palate, and posterior pharynx Cardiac: RRR; no murmurs, rubs, or gallops,no edema - no significant varicosities Respiratory:  normal respiratory rate and pattern with no distress - normal breath sounds with no rales, rhonchi, wheezes or rubs     Wt Readings from Last 3 Encounters:  08/16/20 145  lb 6.4 oz (66 kg)  03/24/20 145 lb (65.8 kg)  02/09/20 146 lb (66.2 kg)    Health Maintenance Due  Topic Date Due  . Hepatitis C Screening  Never done  . HIV Screening  Never done  . PAP SMEAR-Modifier  08/27/2016    There are no preventive care reminders to display for this patient.        Assessment & Plan:   Problem List Items Addressed This Visit      Nervous and Auditory   Non-recurrent acute serous otitis media of left ear   Relevant Medications   amoxicillin (AMOXIL) 875 MG tablet     Other   Mixed hyperlipidemia - Primary   Relevant Orders   Comprehensive metabolic panel   Lipid panel       Meds ordered this encounter  Medications  . amoxicillin (AMOXIL) 875 MG tablet    Sig: Take 1 tablet (875 mg total) by mouth 2 (two) times daily.    Dispense:  20 tablet    Refill:  0    Order Specific Question:   Supervising Provider    Answer:   COX, KIRSTEN 10/27/2016     SARA R Cortny Bambach, PA-C

## 2020-08-17 LAB — COMPREHENSIVE METABOLIC PANEL
ALT: 25 IU/L (ref 0–32)
AST: 28 IU/L (ref 0–40)
Albumin/Globulin Ratio: 1.7 (ref 1.2–2.2)
Albumin: 4.5 g/dL (ref 3.8–4.9)
Alkaline Phosphatase: 102 IU/L (ref 44–121)
BUN/Creatinine Ratio: 22 (ref 9–23)
BUN: 14 mg/dL (ref 6–24)
Bilirubin Total: 0.4 mg/dL (ref 0.0–1.2)
CO2: 22 mmol/L (ref 20–29)
Calcium: 9.6 mg/dL (ref 8.7–10.2)
Chloride: 104 mmol/L (ref 96–106)
Creatinine, Ser: 0.65 mg/dL (ref 0.57–1.00)
Globulin, Total: 2.7 g/dL (ref 1.5–4.5)
Glucose: 85 mg/dL (ref 65–99)
Potassium: 4.3 mmol/L (ref 3.5–5.2)
Sodium: 142 mmol/L (ref 134–144)
Total Protein: 7.2 g/dL (ref 6.0–8.5)
eGFR: 101 mL/min/{1.73_m2} (ref >59)

## 2020-08-17 LAB — CARDIOVASCULAR RISK ASSESSMENT

## 2020-08-17 LAB — LIPID PANEL
Chol/HDL Ratio: 3 ratio (ref 0.0–4.4)
Cholesterol, Total: 192 mg/dL (ref 100–199)
HDL: 65 mg/dL (ref >39)
LDL Chol Calc (NIH): 109 mg/dL — ABNORMAL HIGH (ref 0–99)
Triglycerides: 103 mg/dL (ref 0–149)
VLDL Cholesterol Cal: 18 mg/dL (ref 5–40)

## 2020-11-01 ENCOUNTER — Other Ambulatory Visit: Payer: Self-pay | Admitting: Physician Assistant

## 2020-11-01 DIAGNOSIS — Z1231 Encounter for screening mammogram for malignant neoplasm of breast: Secondary | ICD-10-CM

## 2020-11-03 ENCOUNTER — Other Ambulatory Visit: Payer: Self-pay | Admitting: Physician Assistant

## 2020-11-03 DIAGNOSIS — E782 Mixed hyperlipidemia: Secondary | ICD-10-CM

## 2020-12-28 ENCOUNTER — Other Ambulatory Visit: Payer: Self-pay

## 2020-12-28 ENCOUNTER — Ambulatory Visit
Admission: RE | Admit: 2020-12-28 | Discharge: 2020-12-28 | Disposition: A | Discharge status: Home / Self Care | Source: Ambulatory Visit | Attending: Physician Assistant | Admitting: Physician Assistant

## 2020-12-28 DIAGNOSIS — Z1231 Encounter for screening mammogram for malignant neoplasm of breast: Secondary | ICD-10-CM

## 2021-02-16 ENCOUNTER — Other Ambulatory Visit: Payer: Self-pay

## 2021-02-16 ENCOUNTER — Ambulatory Visit (INDEPENDENT_AMBULATORY_CARE_PROVIDER_SITE_OTHER): Admitting: Physician Assistant

## 2021-02-16 ENCOUNTER — Encounter: Payer: Self-pay | Admitting: Physician Assistant

## 2021-02-16 VITALS — BP 118/70 | HR 63 | Temp 97.5°F | Ht 61.0 in | Wt 147.2 lb

## 2021-02-16 DIAGNOSIS — E782 Mixed hyperlipidemia: Secondary | ICD-10-CM | POA: Diagnosis not present

## 2021-02-16 DIAGNOSIS — R102 Pelvic and perineal pain: Secondary | ICD-10-CM

## 2021-02-16 DIAGNOSIS — M545 Low back pain, unspecified: Secondary | ICD-10-CM

## 2021-02-16 LAB — POCT URINALYSIS DIP (CLINITEK)
Bilirubin, UA: NEGATIVE
Blood, UA: NEGATIVE
Glucose, UA: NEGATIVE mg/dL
Ketones, POC UA: NEGATIVE mg/dL
Leukocytes, UA: NEGATIVE
Nitrite, UA: NEGATIVE
POC PROTEIN,UA: NEGATIVE
Spec Grav, UA: 1.02 (ref 1.010–1.025)
Urobilinogen, UA: 0.2 E.U./dL
pH, UA: 6 (ref 5.0–8.0)

## 2021-02-16 NOTE — Progress Notes (Signed)
Subjective:  Patient ID: Melody Berger, female    DOB: [birth year]  Age: 60 y.o. MRN: [id number]  Chief Complaint  Patient presents with   Hyperlipidemia    6 month follow up fasting    HPI  Mixed hyperlipidemia  Pt presents with hyperlipidemia. Compliance with treatment has been good The patient is compliant with medications, maintains a low cholesterol diet , follows up as directed , and maintains an exercise regimen . The patient denies experiencing any hypercholesterolemia related symptoms. Currently on crestor 5mg  qd  Pt mentions that she has had intermittent pelvic pain off and on for the past 6 months - cannot recall last time she had pap smear and would like to be referred to GYN  Pt also mentions she has had some low back pain for the past few days - worse with certain movements - esp when going from laying position to sitting Denies trouble with bms, no urine symptoms Denies pain or numbness to lower extremities  Current Outpatient Medications on File Prior to Visit  Medication Sig Dispense Refill   Multiple Vitamins-Minerals (MULTIVITAMIN PO) Take by mouth.     rosuvastatin (CRESTOR) 5 MG tablet TAKE 1 TABLET(5 MG) BY MOUTH DAILY 90 tablet 1   No current facility-administered medications on file prior to visit.   Past Medical History:  Diagnosis Date   Hyperlipemia    Past Surgical History:  Procedure Laterality Date   TUBAL LIGATION      Family History  Problem Relation Age of Onset   Osteoporosis Mother    Cancer Father    Breast cancer Cousin        in 6's or 60's   Social History   Socioeconomic History   Marital status: Married    Spouse name: Not on file   Number of children: Not on file   Years of education: Not on file   Highest education level: Not on file  Occupational History   Occupation: unemployed  Tobacco Use   Smoking status: Never   Smokeless tobacco: Never  Vaping Use   Vaping Use: Never used  Substance and Sexual Activity   Alcohol  use: No   Drug use: No   Sexual activity: Not Currently    Birth control/protection: Surgical    Comment: Tubal Ligation   Other Topics Concern   Not on file  Social History Narrative   Not on file   Social Determinants of Health   Financial Resource Strain: Not on file  Food Insecurity: Not on file  Transportation Needs: Not on file  Physical Activity: Not on file  Stress: Not on file  Social Connections: Not on file    Review of Systems CONSTITUTIONAL: Negative for chills, fatigue, fever, unintentional weight gain and unintentional weight loss.  CARDIOVASCULAR: Negative for chest pain, dizziness, palpitations and pedal edema.  RESPIRATORY: Negative for recent cough and dyspnea.  GASTROINTESTINAL: Negative for abdominal pain, acid reflux symptoms, constipation, diarrhea, nausea and vomiting.  MSK: see HPI INTEGUMENTARY: Negative for rash.     Objective:  PHYSICAL EXAM:   VS: BP 118/70   Pulse 63   Temp (!) 97.5 F (36.4 C)   Ht 5\' 1"  (1.549 m)   Wt 147 lb 3.2 oz (66.8 kg)   LMP 05/22/2013   SpO2 97%   BMI 27.81 kg/m   GEN: Well nourished, well developed, in no acute distress  Cardiac: RRR; no murmurs, rubs, or gallops,no edema -  Respiratory:  normal respiratory rate and pattern  with no distress - normal breath sounds with no rales, rhonchi, wheezes or rubs GI: normal bowel sounds, no masses or tenderness MS: no deformity or atrophy - mild tenderness to left lower back Skin: warm and dry, no rash  Psych: euthymic mood, appropriate affect and demeanor   Diabetic Foot Exam - Simple   No data filed      Lab Results  Component Value Date   WBC 5.7 08/12/2019   HGB 15.0 08/12/2019   HCT 45.8 08/12/2019   PLT 155 08/12/2019   GLUCOSE 85 08/16/2020   CHOL 192 08/16/2020   TRIG 103 08/16/2020   HDL 65 08/16/2020   LDLCALC 109 (H) 08/16/2020   ALT 25 08/16/2020   AST 28 08/16/2020   NA 142 08/16/2020   K 4.3 08/16/2020   CL 104 08/16/2020   CREATININE  0.65 08/16/2020   BUN 14 08/16/2020   CO2 22 08/16/2020      Assessment & Plan:   1. Acute left-sided low back pain without sciatica - POCT URINALYSIS DIP (CLINITEK) - CBC with Differential/Platelet - Comprehensive metabolic panel Recommend advil bid Follow up if any symptoms change or worsen 2. Mixed hyperlipidemia - Comprehensive metabolic panel - Lipid panel Take meds as directed Watch diet 3. Pelvic pain - Ambulatory referral to Gynecology    No orders of the defined types were placed in this encounter.   Orders Placed This Encounter  Procedures   CBC with Differential/Platelet   Comprehensive metabolic panel   Lipid panel   Ambulatory referral to Gynecology   POCT URINALYSIS DIP (CLINITEK)     Follow-up: Return in about 6 months (around 08/16/2021) for chronic fasting follow up.  An After Visit Summary was printed and given to the patient.  Jettie Pagan [facility] [phone]

## 2021-02-17 ENCOUNTER — Other Ambulatory Visit: Payer: Self-pay | Admitting: Physician Assistant

## 2021-02-17 DIAGNOSIS — R899 Unspecified abnormal finding in specimens from other organs, systems and tissues: Secondary | ICD-10-CM

## 2021-02-17 LAB — COMPREHENSIVE METABOLIC PANEL
ALT: 29 IU/L (ref 0–32)
AST: 27 IU/L (ref 0–40)
Albumin/Globulin Ratio: 2.5 — ABNORMAL HIGH (ref 1.2–2.2)
Albumin: 4.9 g/dL (ref 3.8–4.9)
Alkaline Phosphatase: 112 IU/L (ref 44–121)
BUN/Creatinine Ratio: 24 (ref 12–28)
BUN: 15 mg/dL (ref 8–27)
Bilirubin Total: 0.3 mg/dL (ref 0.0–1.2)
CO2: 23 mmol/L (ref 20–29)
Calcium: 9.4 mg/dL (ref 8.7–10.3)
Chloride: 103 mmol/L (ref 96–106)
Creatinine, Ser: 0.62 mg/dL (ref 0.57–1.00)
Globulin, Total: 2 g/dL (ref 1.5–4.5)
Glucose: 92 mg/dL (ref 65–99)
Potassium: 4.7 mmol/L (ref 3.5–5.2)
Sodium: 141 mmol/L (ref 134–144)
Total Protein: 6.9 g/dL (ref 6.0–8.5)
eGFR: 102 mL/min/{1.73_m2} (ref >59)

## 2021-02-17 LAB — LIPID PANEL
Chol/HDL Ratio: 2.7 ratio (ref 0.0–4.4)
Cholesterol, Total: 182 mg/dL (ref 100–199)
HDL: 67 mg/dL (ref >39)
LDL Chol Calc (NIH): 102 mg/dL — ABNORMAL HIGH (ref 0–99)
Triglycerides: 70 mg/dL (ref 0–149)
VLDL Cholesterol Cal: 13 mg/dL (ref 5–40)

## 2021-02-17 LAB — CBC WITH DIFFERENTIAL/PLATELET
Basophils Absolute: 0 10*3/uL (ref 0.0–0.2)
Basos: 1 %
EOS (ABSOLUTE): 0.1 10*3/uL (ref 0.0–0.4)
Eos: 2 %
Hematocrit: 43.6 % (ref 34.0–46.6)
Hemoglobin: 14.4 g/dL (ref 11.1–15.9)
Immature Grans (Abs): 0 10*3/uL (ref 0.0–0.1)
Immature Granulocytes: 0 %
Lymphocytes Absolute: 2.1 10*3/uL (ref 0.7–3.1)
Lymphs: 32 %
MCH: 28.9 pg (ref 26.6–33.0)
MCHC: 33 g/dL (ref 31.5–35.7)
MCV: 88 fL (ref 79–97)
Monocytes Absolute: 0.5 10*3/uL (ref 0.1–0.9)
Monocytes: 7 %
Neutrophils Absolute: 3.9 10*3/uL (ref 1.4–7.0)
Neutrophils: 58 %
Platelets: 128 10*3/uL — ABNORMAL LOW (ref 150–450)
RBC: 4.98 x10E6/uL (ref 3.77–5.28)
RDW: 13.1 % (ref 11.7–15.4)
WBC: 6.6 10*3/uL (ref 3.4–10.8)

## 2021-02-17 LAB — CARDIOVASCULAR RISK ASSESSMENT

## 2021-03-23 ENCOUNTER — Other Ambulatory Visit: Payer: Self-pay

## 2021-03-23 ENCOUNTER — Ambulatory Visit

## 2021-03-23 DIAGNOSIS — R899 Unspecified abnormal finding in specimens from other organs, systems and tissues: Secondary | ICD-10-CM

## 2021-03-24 LAB — CBC WITH DIFFERENTIAL/PLATELET
Basophils Absolute: 0 10*3/uL (ref 0.0–0.2)
Basos: 0 %
EOS (ABSOLUTE): 0.2 10*3/uL (ref 0.0–0.4)
Eos: 3 %
Hematocrit: 45.1 % (ref 34.0–46.6)
Hemoglobin: 14.5 g/dL (ref 11.1–15.9)
Immature Grans (Abs): 0 10*3/uL (ref 0.0–0.1)
Immature Granulocytes: 0 %
Lymphocytes Absolute: 2.4 10*3/uL (ref 0.7–3.1)
Lymphs: 32 %
MCH: 28.5 pg (ref 26.6–33.0)
MCHC: 32.2 g/dL (ref 31.5–35.7)
MCV: 89 fL (ref 79–97)
Monocytes Absolute: 0.5 10*3/uL (ref 0.1–0.9)
Monocytes: 6 %
Neutrophils Absolute: 4.3 10*3/uL (ref 1.4–7.0)
Neutrophils: 59 %
Platelets: 136 10*3/uL — ABNORMAL LOW (ref 150–450)
RBC: 5.09 x10E6/uL (ref 3.77–5.28)
RDW: 12.8 % (ref 11.7–15.4)
WBC: 7.4 10*3/uL (ref 3.4–10.8)

## 2021-05-20 ENCOUNTER — Other Ambulatory Visit: Payer: Self-pay | Admitting: Family Medicine

## 2021-05-20 DIAGNOSIS — E782 Mixed hyperlipidemia: Secondary | ICD-10-CM

## 2021-06-27 ENCOUNTER — Ambulatory Visit (INDEPENDENT_AMBULATORY_CARE_PROVIDER_SITE_OTHER): Admitting: Physician Assistant

## 2021-06-27 ENCOUNTER — Encounter: Payer: Self-pay | Admitting: Physician Assistant

## 2021-06-27 ENCOUNTER — Other Ambulatory Visit: Payer: Self-pay

## 2021-06-27 VITALS — BP 110/70 | HR 68 | Temp 97.2°F | Ht 61.0 in | Wt 144.8 lb

## 2021-06-27 DIAGNOSIS — M79602 Pain in left arm: Secondary | ICD-10-CM | POA: Diagnosis not present

## 2021-06-27 DIAGNOSIS — M79601 Pain in right arm: Secondary | ICD-10-CM

## 2021-06-27 DIAGNOSIS — R899 Unspecified abnormal finding in specimens from other organs, systems and tissues: Secondary | ICD-10-CM | POA: Diagnosis not present

## 2021-06-27 LAB — CBC WITH DIFFERENTIAL/PLATELET
Basophils Absolute: 0 10*3/uL (ref 0.0–0.2)
Basos: 1 %
EOS (ABSOLUTE): 0.1 10*3/uL (ref 0.0–0.4)
Eos: 2 %
Hematocrit: 42.6 % (ref 34.0–46.6)
Hemoglobin: 14.1 g/dL (ref 11.1–15.9)
Immature Grans (Abs): 0 10*3/uL (ref 0.0–0.1)
Immature Granulocytes: 0 %
Lymphocytes Absolute: 2.4 10*3/uL (ref 0.7–3.1)
Lymphs: 36 %
MCH: 28.5 pg (ref 26.6–33.0)
MCHC: 33.1 g/dL (ref 31.5–35.7)
MCV: 86 fL (ref 79–97)
Monocytes Absolute: 0.4 10*3/uL (ref 0.1–0.9)
Monocytes: 7 %
Neutrophils Absolute: 3.6 10*3/uL (ref 1.4–7.0)
Neutrophils: 54 %
Platelets: 155 10*3/uL (ref 150–450)
RBC: 4.94 x10E6/uL (ref 3.77–5.28)
RDW: 13.3 % (ref 11.7–15.4)
WBC: 6.6 10*3/uL (ref 3.4–10.8)

## 2021-06-27 MED ORDER — MELOXICAM 7.5 MG PO TABS: 7.5000 mg | ORAL_TABLET | Freq: Every day | ORAL | 0 refills | Status: DC

## 2021-06-27 NOTE — Progress Notes (Signed)
Subjective:  Patient ID: Melody Berger, female    DOB: 18-Jun-1961  Age: 61 y.o. 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     rosuvastatin (CRESTOR) 5 MG tablet TAKE 1 TABLET(5 MG) BY MOUTH DAILY 90 tablet 0   No current facility-administered medications on file prior to visit.   Past Medical History:  Diagnosis Date   Hyperlipemia    Past Surgical History:  Procedure Laterality Date   TUBAL LIGATION      Family History  Problem Relation Age of Onset   Osteoporosis Mother    Cancer Father    Breast cancer Cousin        in 36's or 24's   Social History   Socioeconomic History   Marital status: Married    Spouse name: Not on file   Number of children: Not on file   Years of education: Not on file   Highest education level: Not on file  Occupational History   Occupation: unemployed  Tobacco Use   Smoking status: Never   Smokeless tobacco: Never  Vaping Use   Vaping Use: Never used  Substance and Sexual Activity   Alcohol use: No   Drug use: No   Sexual activity: Not Currently    Birth control/protection: Surgical    Comment: Tubal Ligation   Other Topics Concern   Not on file  Social History Narrative   Not on file   Social Determinants of Health   Financial Resource Strain: Not on file  Food Insecurity: Not on file  Transportation Needs: Not on file  Physical Activity: Not on file  Stress: Not on file  Social Connections: Not on file    Review of Systems CONSTITUTIONAL: Negative for chills, fatigue, fever,  unintentional weight gain and unintentional weight loss.  CARDIOVASCULAR: Negative for chest pain, dizziness, palpitations and pedal edema.  RESPIRATORY: Negative for recent cough and dyspnea.  MSK: see HPI    Objective:  BP 110/70 (BP Location: Left Arm, Patient Position: Sitting, Cuff Size: Normal)    Pulse 68    Temp (!) 97.2 F (36.2 C) (Temporal)    Ht 5\' 1"  (1.549 m)    Wt 144 lb 12.8 oz (65.7 kg)    LMP 05/22/2013    SpO2 96%    BMI 27.36 kg/m   BP/Weight 06/27/2021 02/16/2021 08/16/2020  Systolic BP 110 118 114  Diastolic BP 70 70 72  Wt. (Lbs) 144.8 147.2 145.4  BMI 27.36 27.81 27.47    Physical Exam PHYSICAL EXAM:   VS: BP 110/70 (BP Location: Left Arm, Patient Position: Sitting, Cuff Size: Normal)    Pulse 68    Temp (!) 97.2 F (36.2 C) (Temporal)    Ht 5\' 1"  (1.549 m)    Wt 144 lb 12.8 oz (65.7 kg)    LMP 05/22/2013    SpO2 96%    BMI 27.36 kg/m   GEN: Well nourished, well developed, in no acute distress  Cardiac: RRR; no murmurs, rubs,  or gallops,no edema -  Respiratory:  normal respiratory rate and pattern with no distress - normal breath sounds with no rales, rhonchi, wheezes or rubs Psych: euthymic mood, appropriate affect and demeanor  Diabetic Foot Exam - Simple   No data filed      Lab Results  Component Value Date   WBC 7.4 03/23/2021   HGB 14.5 03/23/2021   HCT 45.1 03/23/2021   PLT 136 (L) 03/23/2021   GLUCOSE 92 02/16/2021   CHOL 182 02/16/2021   TRIG 70 02/16/2021   HDL 67 02/16/2021   LDLCALC 102 (H) 02/16/2021   ALT 29 02/16/2021   AST 27 02/16/2021   NA 141 02/16/2021   K 4.7 02/16/2021   CL 103 02/16/2021   CREATININE 0.62 02/16/2021   BUN 15 02/16/2021   CO2 23 02/16/2021      Assessment & Plan:   Problem List Items Addressed This Visit   None Visit Diagnoses     Pain in both upper extremities    -  Primary   Relevant Medications   meloxicam (MOBIC) 7.5 MG tablet   Abnormal laboratory test       Relevant Orders   CBC with  Differential/Platelet     .  Meds ordered this encounter  Medications   meloxicam (MOBIC) 7.5 MG tablet    Sig: Take 1 tablet (7.5 mg total) by mouth daily.    Dispense:  30 tablet    Refill:  0    Order Specific Question:   Supervising Provider    AnswerCorey Harold    Orders Placed This Encounter  Procedures   CBC with Differential/Platelet     Follow-up: Return for in mid to late June for fasting physical.  An After Visit Summary was printed and given to the patient.  Jettie Pagan [facility] [phone]

## 2021-08-17 ENCOUNTER — Ambulatory Visit: Admitting: Physician Assistant

## 2021-08-17 ENCOUNTER — Ambulatory Visit (INDEPENDENT_AMBULATORY_CARE_PROVIDER_SITE_OTHER): Admitting: Physician Assistant

## 2021-08-17 ENCOUNTER — Other Ambulatory Visit: Payer: Self-pay

## 2021-08-17 ENCOUNTER — Encounter: Payer: Self-pay | Admitting: Physician Assistant

## 2021-08-17 VITALS — BP 108/72 | HR 70 | Temp 97.3°F | Ht 61.0 in | Wt 143.0 lb

## 2021-08-17 DIAGNOSIS — E782 Mixed hyperlipidemia: Secondary | ICD-10-CM

## 2021-08-17 MED ORDER — ROSUVASTATIN CALCIUM 5 MG PO TABS: ORAL_TABLET | ORAL | 1 refills | Status: DC

## 2021-08-17 NOTE — Progress Notes (Signed)
? ?Subjective:  ?Patient ID: Melody Berger, female    DOB: April 17, 1961  Age: 61 y.o. MRN: [id number] ? ?Chief Complaint  ?Patient presents with  ? Hyperlipidemia  ? ? ?HPI ? Mixed hyperlipidemia  ?Pt presents with hyperlipidemia. Compliance with treatment has been goodThe patient is compliant with medications, maintains a low cholesterol diet , follows up as directed , and maintains an exercise regimen . The patient denies experiencing any hypercholesterolemia related symptoms.  ?She is currently taking crestor 5 mg qd ?Current Outpatient Medications on File Prior to Visit  ?Medication Sig Dispense Refill  ? Multiple Vitamins-Minerals (MULTIVITAMIN PO) Take by mouth.    ? ?No current facility-administered medications on file prior to visit.  ? ?Past Medical History:  ?Diagnosis Date  ? Hyperlipemia   ? ?Past Surgical History:  ?Procedure Laterality Date  ? TUBAL LIGATION    ?  ?Family History  ?Problem Relation Age of Onset  ? Osteoporosis Mother   ? Cancer Father   ? Breast cancer Cousin   ?     in 56's or 73's  ? ?Social History  ? ?Socioeconomic History  ? Marital status: Married  ?  Spouse name: Not on file  ? Number of children: Not on file  ? Years of education: Not on file  ? Highest education level: Not on file  ?Occupational History  ? Occupation: unemployed  ?Tobacco Use  ? Smoking status: Never  ? Smokeless tobacco: Never  ?Vaping Use  ? Vaping Use: Never used  ?Substance and Sexual Activity  ? Alcohol use: No  ? Drug use: No  ? Sexual activity: Not Currently  ?  Birth control/protection: Surgical  ?  Comment: Tubal Ligation   ?Other Topics Concern  ? Not on file  ?Social History Narrative  ? Not on file  ? ?Social Determinants of Health  ? ?Financial Resource Strain: Not on file  ?Food Insecurity: Not on file  ?Transportation Needs: Not on file  ?Physical Activity: Not on file  ?Stress: Not on file  ?Social Connections: Not on file  ? ? ?Review of Systems ?CONSTITUTIONAL: Negative for chills, fatigue, fever,  unintentional weight gain and unintentional weight loss.  ?CARDIOVASCULAR: Negative for chest pain, dizziness, palpitations and pedal edema.  ?RESPIRATORY: Negative for recent cough and dyspnea.  ?GASTROINTESTINAL: Negative for abdominal pain, acid reflux symptoms, constipation, diarrhea, nausea and vomiting.  ? ? ? ?Objective:  ?BP 108/72 (BP Location: Left Arm, Patient Position: Sitting, Cuff Size: Normal)   Pulse 70   Temp (!) 97.3 ?F (36.3 ?C) (Temporal)   Ht 5\' 1"  (1.549 m)   Wt 143 lb (64.9 kg)   LMP 05/22/2013   SpO2 98%   BMI 27.02 kg/m?  ? ?BP/Weight 08/17/2021 06/27/2021 02/16/2021  ?Systolic BP 108 110 118  ?Diastolic BP 72 70 70  ?Wt. (Lbs) 143 144.8 147.2  ?BMI 27.02 27.36 27.81  ? ? ?Physical Exam ?PHYSICAL EXAM:  ? ?VS: BP 108/72 (BP Location: Left Arm, Patient Position: Sitting, Cuff Size: Normal)   Pulse 70   Temp (!) 97.3 ?F (36.3 ?C) (Temporal)   Ht 5\' 1"  (1.549 m)   Wt 143 lb (64.9 kg)   LMP 05/22/2013   SpO2 98%   BMI 27.02 kg/m?  ? ?GEN: Well nourished, well developed, in no acute distress  ?Cardiac: RRR; no murmurs,  ?Respiratory:  normal respiratory rate and pattern with no distress - normal breath sounds with no rales, rhonchi, wheezes or rubs ? ?Psych: euthymic mood, appropriate affect  and demeanor ? ?Diabetic Foot Exam - Simple   ?No data filed ?  ?  ? ?Lab Results  ?Component Value Date  ? WBC 6.6 06/27/2021  ? HGB 14.1 06/27/2021  ? HCT 42.6 06/27/2021  ? PLT 155 06/27/2021  ? GLUCOSE 92 02/16/2021  ? CHOL 182 02/16/2021  ? TRIG 70 02/16/2021  ? HDL 67 02/16/2021  ? LDLCALC 102 (H) 02/16/2021  ? ALT 29 02/16/2021  ? AST 27 02/16/2021  ? NA 141 02/16/2021  ? K 4.7 02/16/2021  ? CL 103 02/16/2021  ? CREATININE 0.62 02/16/2021  ? BUN 15 02/16/2021  ? CO2 23 02/16/2021  ? ? ? ? ?Assessment & Plan:  ? ?Problem List Items Addressed This Visit   ? ?  ? Other  ? Mixed hyperlipidemia - Primary  ? Relevant Medications  ? rosuvastatin (CRESTOR) 5 MG tablet  ?. ? ?Meds ordered this  encounter  ?Medications  ? rosuvastatin (CRESTOR) 5 MG tablet  ?  Sig: TAKE 1 TABLET(5 MG) BY MOUTH DAILY  ?  Dispense:  90 tablet  ?  Refill:  1  ?  Order Specific Question:   Supervising Provider  ?  AnswerBlane Ohara [[id number]]  ? ? ?No orders of the defined types were placed in this encounter. ?  ? ?Follow-up: Return in about 6 months (around 02/17/2022) for fasting physical - can cancel appt in june. ? ?An After Visit Summary was printed and given to the patient. ? ?SARA R Mance Vallejo, PA-C ?[facility] ?([phone]

## 2021-08-17 NOTE — Addendum Note (Signed)
Addended byMarianne Sofia on: 08/17/2021 04:51 PM ? ? Modules accepted: Orders ? ?

## 2021-08-18 LAB — COMPREHENSIVE METABOLIC PANEL
ALT: 20 IU/L (ref 0–32)
AST: 23 IU/L (ref 0–40)
Albumin/Globulin Ratio: 1.8 (ref 1.2–2.2)
Albumin: 4.7 g/dL (ref 3.8–4.9)
Alkaline Phosphatase: 88 IU/L (ref 44–121)
BUN/Creatinine Ratio: 18 (ref 12–28)
BUN: 13 mg/dL (ref 8–27)
Bilirubin Total: 0.6 mg/dL (ref 0.0–1.2)
CO2: 24 mmol/L (ref 20–29)
Calcium: 9.6 mg/dL (ref 8.7–10.3)
Chloride: 101 mmol/L (ref 96–106)
Creatinine, Ser: 0.73 mg/dL (ref 0.57–1.00)
Globulin, Total: 2.6 g/dL (ref 1.5–4.5)
Glucose: 76 mg/dL (ref 70–99)
Potassium: 4.2 mmol/L (ref 3.5–5.2)
Sodium: 140 mmol/L (ref 134–144)
Total Protein: 7.3 g/dL (ref 6.0–8.5)
eGFR: 94 mL/min/{1.73_m2} (ref >59)

## 2021-08-18 LAB — LIPID PANEL
Chol/HDL Ratio: 2.8 ratio (ref 0.0–4.4)
Cholesterol, Total: 183 mg/dL (ref 100–199)
HDL: 66 mg/dL (ref >39)
LDL Chol Calc (NIH): 102 mg/dL — ABNORMAL HIGH (ref 0–99)
Triglycerides: 80 mg/dL (ref 0–149)
VLDL Cholesterol Cal: 15 mg/dL (ref 5–40)

## 2021-08-18 LAB — CARDIOVASCULAR RISK ASSESSMENT

## 2021-10-02 ENCOUNTER — Encounter: Payer: Self-pay | Admitting: Physician Assistant

## 2021-10-02 ENCOUNTER — Ambulatory Visit (INDEPENDENT_AMBULATORY_CARE_PROVIDER_SITE_OTHER): Admitting: Physician Assistant

## 2021-10-02 VITALS — BP 108/62 | HR 80 | Temp 97.3°F | Resp 14 | Ht 61.0 in | Wt 141.6 lb

## 2021-10-02 DIAGNOSIS — R300 Dysuria: Secondary | ICD-10-CM

## 2021-10-02 DIAGNOSIS — B3731 Acute candidiasis of vulva and vagina: Secondary | ICD-10-CM | POA: Diagnosis not present

## 2021-10-02 LAB — POCT URINALYSIS DIPSTICK
Bilirubin, UA: NEGATIVE
Blood, UA: NEGATIVE
Glucose, UA: NEGATIVE
Ketones, UA: NEGATIVE
Leukocytes, UA: NEGATIVE
Nitrite, UA: NEGATIVE
Protein, UA: NEGATIVE
Spec Grav, UA: 1.02 (ref 1.010–1.025)
Urobilinogen, UA: 0.2 E.U./dL
pH, UA: 6 (ref 5.0–8.0)

## 2021-10-02 MED ORDER — FLUCONAZOLE 150 MG PO TABS: ORAL_TABLET | ORAL | 0 refills | Status: DC

## 2021-10-02 NOTE — Progress Notes (Signed)
? ?Acute Office Visit ? ?Subjective:  ? ? Patient ID: Melody Berger, female    DOB: 05-Dec-1960, 61 y.o.   MRN: [id number] ? ?Chief Complaint  ?Patient presents with  ? Vaginal Itching  ? Dysuria  ? ? ?HPI: ?Patient is in today for vaginal itching and burning - this started about a week ago ?She has tried otc medications including vagisil (which she had allergic reaction to) as well as monistat which helped minimally but still having issues ?Denies urine symptoms except slight burning with urination ? ?Past Medical History:  ?Diagnosis Date  ? Hyperlipemia   ? ? ?Past Surgical History:  ?Procedure Laterality Date  ? TUBAL LIGATION    ? ? ?Family History  ?Problem Relation Age of Onset  ? Osteoporosis Mother   ? Cancer Father   ? Breast cancer Cousin   ?     in 92's or 62's  ? ? ?Social History  ? ?Socioeconomic History  ? Marital status: Married  ?  Spouse name: Not on file  ? Number of children: Not on file  ? Years of education: Not on file  ? Highest education level: Not on file  ?Occupational History  ? Occupation: unemployed  ?Tobacco Use  ? Smoking status: Never  ? Smokeless tobacco: Never  ?Vaping Use  ? Vaping Use: Never used  ?Substance and Sexual Activity  ? Alcohol use: No  ? Drug use: No  ? Sexual activity: Not Currently  ?  Birth control/protection: Surgical  ?  Comment: Tubal Ligation   ?Other Topics Concern  ? Not on file  ?Social History Narrative  ? Not on file  ? ?Social Determinants of Health  ? ?Financial Resource Strain: Not on file  ?Food Insecurity: Not on file  ?Transportation Needs: Not on file  ?Physical Activity: Not on file  ?Stress: Not on file  ?Social Connections: Not on file  ?Intimate Partner Violence: Not on file  ? ? ?Outpatient Medications Prior to Visit  ?Medication Sig Dispense Refill  ? Multiple Vitamins-Minerals (MULTIVITAMIN PO) Take by mouth.    ? rosuvastatin (CRESTOR) 5 MG tablet TAKE 1 TABLET(5 MG) BY MOUTH DAILY 90 tablet 1  ? ?No facility-administered medications prior to  visit.  ? ? ?Allergies  ?Allergen Reactions  ? Terbinafine And Related   ? ? ?Review of Systems ?CONSTITUTIONAL: Negative for chills, fatigue, fever, unintentional weight gain and unintentional weight loss.  ?CARDIOVASCULAR: Negative for chest pain, dizziness, palpitations and pedal edema.  ?RESPIRATORY: Negative for recent cough and dyspnea.  ?GASTROINTESTINAL: Negative for abdominal pain, acid reflux symptoms, constipation, diarrhea, nausea and vomiting.  ?GU - see HPI ? ?   ?Objective:  ?  ?Physical Exam ? ?BP 108/62   Pulse 80   Temp (!) 97.3 ?F (36.3 ?C)   Resp 14   Ht 5' 1"  (1.549 m)   Wt 141 lb 9.6 oz (64.2 kg)   LMP 05/22/2013   BMI 26.76 kg/m?  ?Wt Readings from Last 3 Encounters:  ?10/02/21 141 lb 9.6 oz (64.2 kg)  ?08/17/21 143 lb (64.9 kg)  ?06/27/21 144 lb 12.8 oz (65.7 kg)  ?PHYSICAL EXAM:  ? ?VS: BP 108/62   Pulse 80   Temp (!) 97.3 ?F (36.3 ?C)   Resp 14   Ht 5' 1"  (1.549 m)   Wt 141 lb 9.6 oz (64.2 kg)   LMP 05/22/2013   BMI 26.76 kg/m?  ? ?GEN: Well nourished, well developed, in no acute distress  ?Cardiac: RRR; no murmurs,  ?  Respiratory:  normal respiratory rate and pattern with no distress - normal breath sounds with no rales, rhonchi, wheezes or rubs ?GU - external genitalia with erythema noted - no vaginal discharge - no lesions ? ? ?Health Maintenance Due  ?Topic Date Due  ? PAP SMEAR-Modifier  08/27/2016  ? Zoster Vaccines- Shingrix (2 of 2) 07/14/2019  ? ? ?There are no preventive care reminders to display for this patient. ? ? ?No results found for: TSH ?Lab Results  ?Component Value Date  ? WBC 6.6 06/27/2021  ? HGB 14.1 06/27/2021  ? HCT 42.6 06/27/2021  ? MCV 86 06/27/2021  ? PLT 155 06/27/2021  ? ?Lab Results  ?Component Value Date  ? NA 140 08/17/2021  ? K 4.2 08/17/2021  ? CO2 24 08/17/2021  ? GLUCOSE 76 08/17/2021  ? BUN 13 08/17/2021  ? CREATININE 0.73 08/17/2021  ? BILITOT 0.6 08/17/2021  ? ALKPHOS 88 08/17/2021  ? AST 23 08/17/2021  ? ALT 20 08/17/2021  ? PROT 7.3  08/17/2021  ? ALBUMIN 4.7 08/17/2021  ? CALCIUM 9.6 08/17/2021  ? EGFR 94 08/17/2021  ? ?Lab Results  ?Component Value Date  ? CHOL 183 08/17/2021  ? ?Lab Results  ?Component Value Date  ? HDL 66 08/17/2021  ? ?Lab Results  ?Component Value Date  ? LDLCALC 102 (H) 08/17/2021  ? ?Lab Results  ?Component Value Date  ? TRIG 80 08/17/2021  ? ?Lab Results  ?Component Value Date  ? CHOLHDL 2.8 08/17/2021  ? ?No results found for: HGBA1C ? ?   ?Assessment & Plan:  ? ?Problem List Items Addressed This Visit   ?None ?Visit Diagnoses   ? ? Dysuria    -  Primary  ? Relevant Orders  ? POCT urinalysis dipstick (Completed)  ? Yeast vaginitis      ? Relevant Medications  ? fluconazole (DIFLUCAN) 150 MG tablet  ? ?  ? ?Meds ordered this encounter  ?Medications  ? fluconazole (DIFLUCAN) 150 MG tablet  ?  Sig: Take 1 po then repeat once in 3 days  ?  Dispense:  2 tablet  ?  Refill:  0  ?  Order Specific Question:   Supervising Provider  ?  AnswerRochel Brome [[id number]]  ? ? ?Orders Placed This Encounter  ?Procedures  ? POCT urinalysis dipstick  ?  ? ?Follow-up: Return if symptoms worsen or fail to improve. ? ?An After Visit Summary was printed and given to the patient. ? ?SARA R Chiquetta Langner, PA-C ?[place] ?[phone]

## 2021-11-23 ENCOUNTER — Other Ambulatory Visit: Payer: Self-pay | Admitting: Physician Assistant

## 2021-11-23 DIAGNOSIS — Z1231 Encounter for screening mammogram for malignant neoplasm of breast: Secondary | ICD-10-CM

## 2021-12-11 ENCOUNTER — Encounter: Admitting: Physician Assistant

## 2021-12-29 ENCOUNTER — Ambulatory Visit

## 2022-01-02 ENCOUNTER — Ambulatory Visit
Admission: RE | Admit: 2022-01-02 | Discharge: 2022-01-02 | Disposition: A | Discharge status: Home / Self Care | Source: Ambulatory Visit | Attending: Physician Assistant | Admitting: Physician Assistant

## 2022-01-02 DIAGNOSIS — Z1231 Encounter for screening mammogram for malignant neoplasm of breast: Secondary | ICD-10-CM

## 2022-01-03 ENCOUNTER — Other Ambulatory Visit: Payer: Self-pay | Admitting: Physician Assistant

## 2022-01-03 DIAGNOSIS — R928 Other abnormal and inconclusive findings on diagnostic imaging of breast: Secondary | ICD-10-CM

## 2022-01-05 ENCOUNTER — Encounter

## 2022-01-05 ENCOUNTER — Other Ambulatory Visit: Payer: Self-pay | Admitting: Physician Assistant

## 2022-01-05 ENCOUNTER — Ambulatory Visit
Admission: RE | Admit: 2022-01-05 | Discharge: 2022-01-05 | Disposition: A | Discharge status: Home / Self Care | Source: Ambulatory Visit | Attending: Physician Assistant | Admitting: Physician Assistant

## 2022-01-05 DIAGNOSIS — R928 Other abnormal and inconclusive findings on diagnostic imaging of breast: Secondary | ICD-10-CM

## 2022-01-08 ENCOUNTER — Other Ambulatory Visit: Payer: Self-pay | Admitting: Physician Assistant

## 2022-01-08 DIAGNOSIS — R928 Other abnormal and inconclusive findings on diagnostic imaging of breast: Secondary | ICD-10-CM

## 2022-01-10 ENCOUNTER — Other Ambulatory Visit: Payer: Self-pay | Admitting: Physician Assistant

## 2022-01-10 DIAGNOSIS — R928 Other abnormal and inconclusive findings on diagnostic imaging of breast: Secondary | ICD-10-CM

## 2022-01-15 ENCOUNTER — Ambulatory Visit
Admission: RE | Admit: 2022-01-15 | Discharge: 2022-01-15 | Disposition: A | Discharge status: Home / Self Care | Source: Ambulatory Visit | Attending: Physician Assistant | Admitting: Physician Assistant

## 2022-01-15 DIAGNOSIS — R928 Other abnormal and inconclusive findings on diagnostic imaging of breast: Secondary | ICD-10-CM

## 2022-02-28 IMAGING — MG DIGITAL SCREENING BILAT W/ TOMO W/ CAD
8 series · 9 of 24 positions shown · non-contrast
Comparison: Previous exam(s).

CLINICAL DATA: Screening.

EXAM:
DIGITAL SCREENING BILATERAL MAMMOGRAM WITH TOMO AND CAD

[R MLO synth-2D]
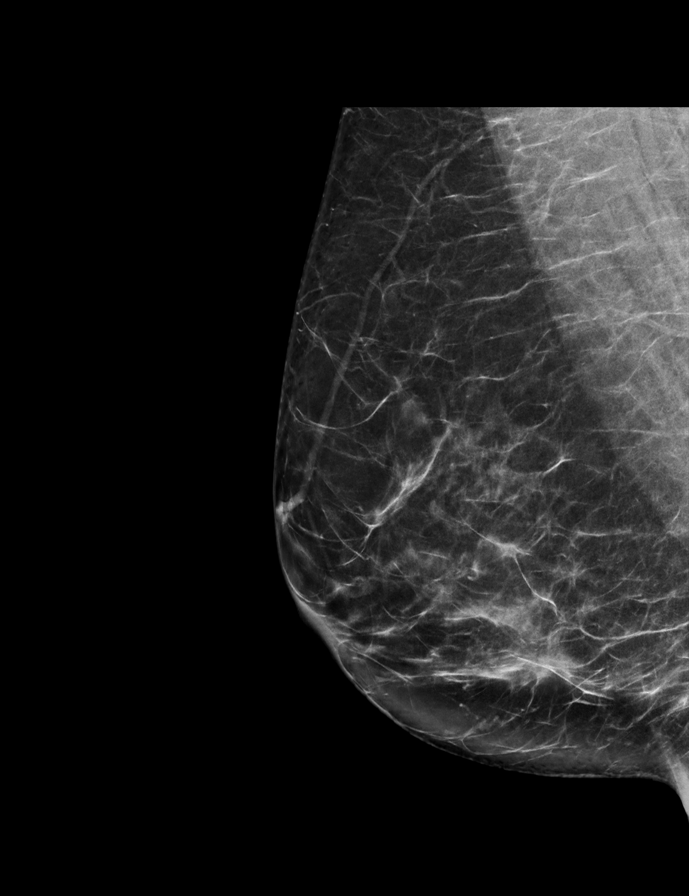

[L MLO synth-2D]
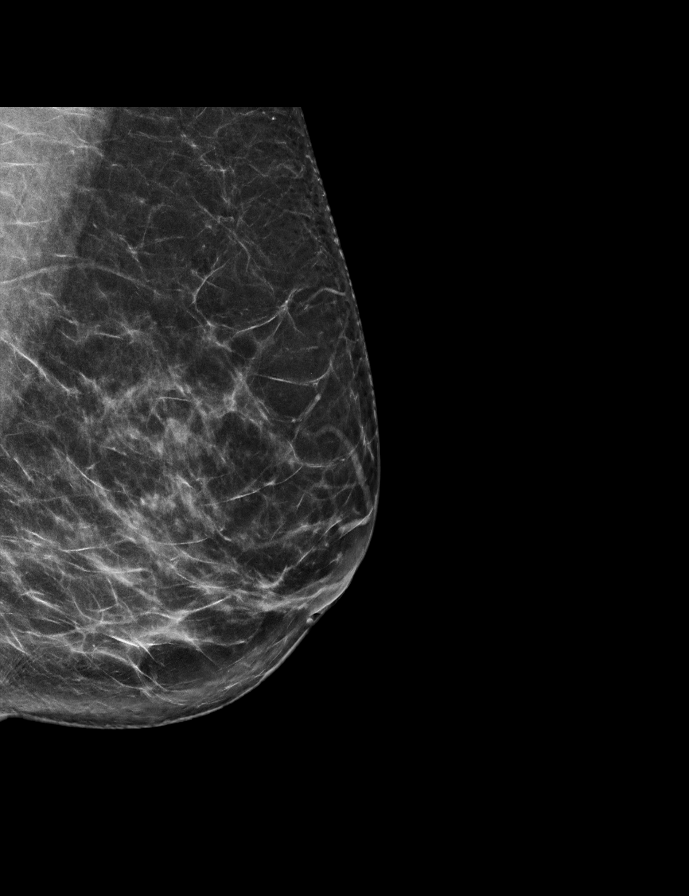

[R CC synth-2D]
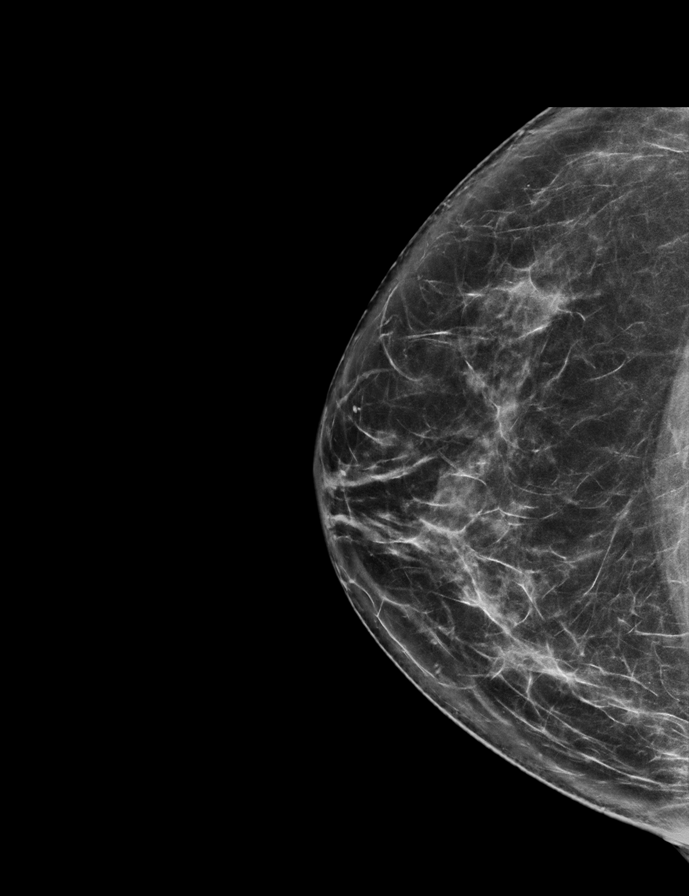

[L CC synth-2D]
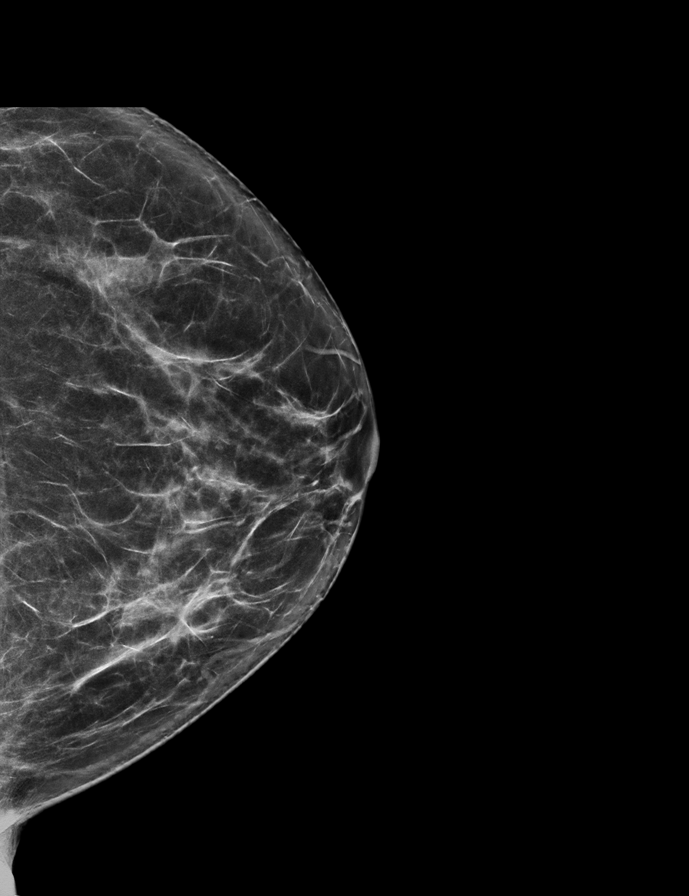

[R MLO tomo · 2 of 72 frames shown]
[frame 24/72]
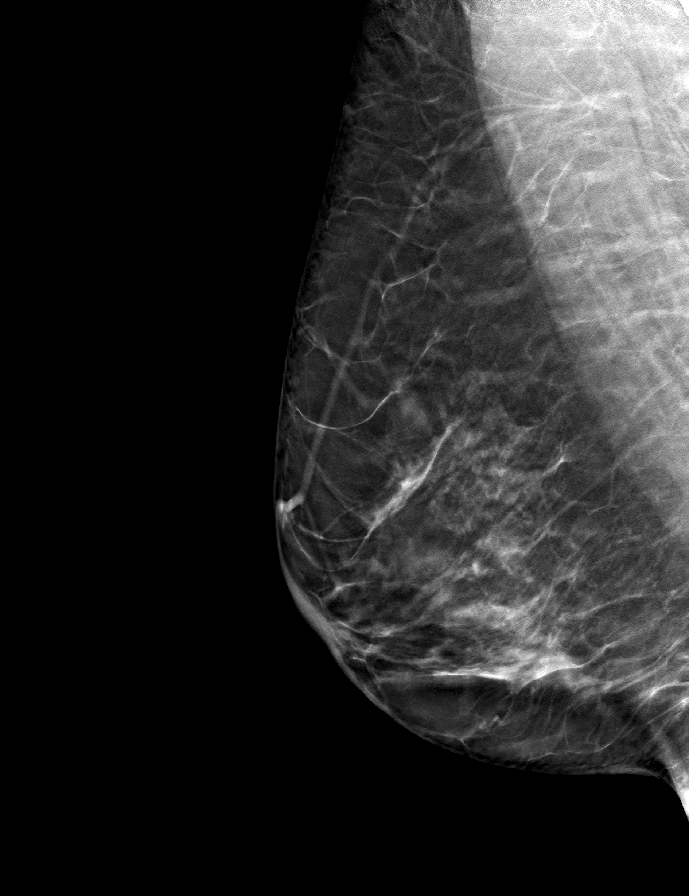
[frame 37/72]
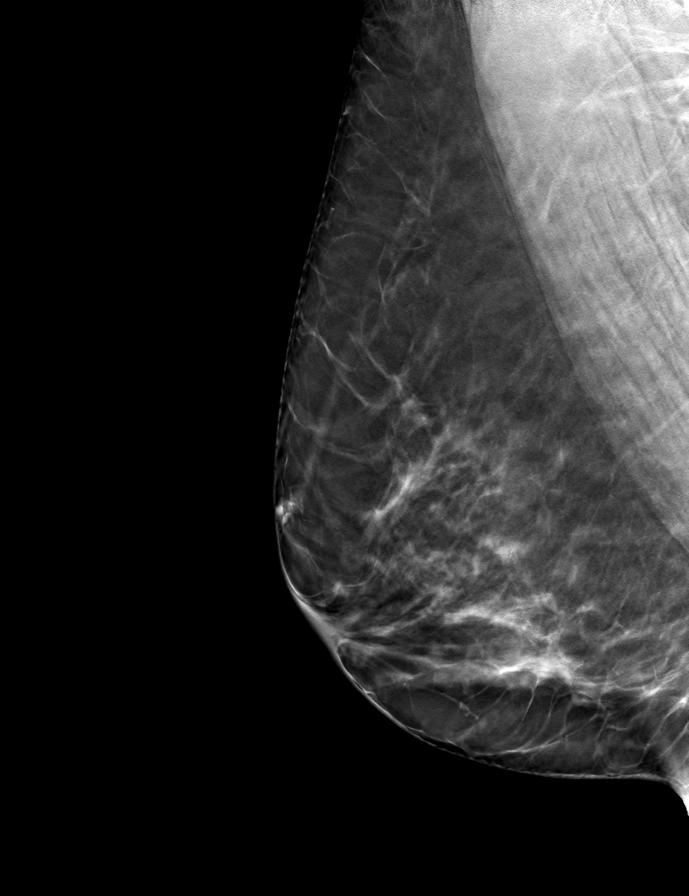

[L CC tomo · tomo slice 34/67.0]
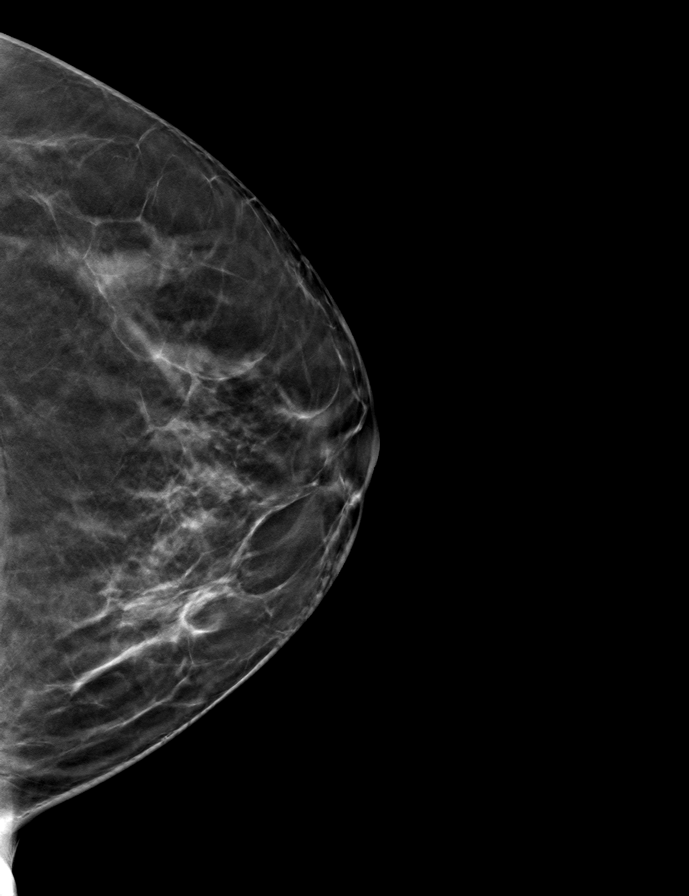

[L MLO tomo · tomo slice 34/67.0]
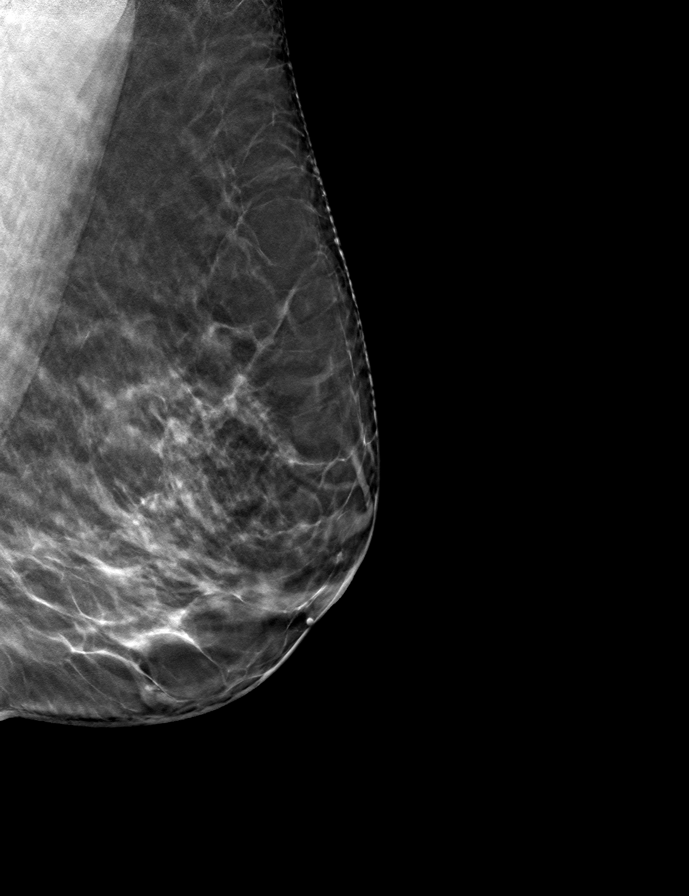

[R CC tomo · tomo slice 37/72.0]
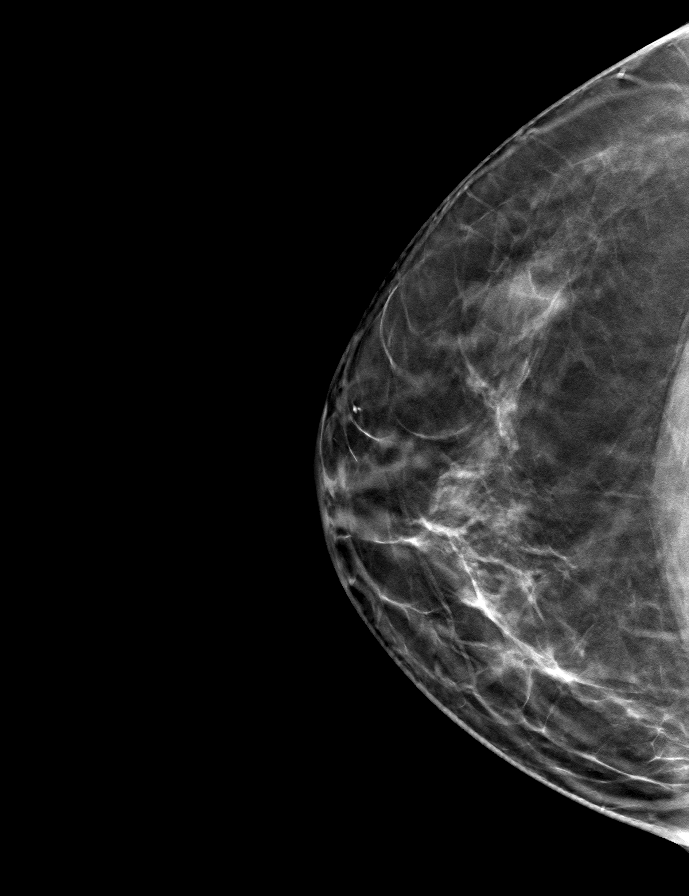

[9 of 24 positions shown; findings below may reference images not displayed]

ACR Breast Density Category b: There are scattered areas of
fibroglandular density.
FINDINGS: There are no findings suspicious for malignancy. Images were
processed with CAD.
IMPRESSION: No mammographic evidence of malignancy. A result letter of this
screening mammogram will be mailed directly to the patient.

RECOMMENDATION:
Screening mammogram in one year. (Code:[id number])

BI-RADS CATEGORY  1: Negative.

## 2022-03-13 ENCOUNTER — Encounter: Payer: Self-pay | Admitting: Physician Assistant

## 2022-03-13 ENCOUNTER — Ambulatory Visit (INDEPENDENT_AMBULATORY_CARE_PROVIDER_SITE_OTHER): Admitting: Physician Assistant

## 2022-03-13 VITALS — BP 106/72 | HR 73 | Temp 97.3°F | Ht 61.0 in | Wt 140.8 lb

## 2022-03-13 DIAGNOSIS — Z0000 Encounter for general adult medical examination without abnormal findings: Secondary | ICD-10-CM | POA: Diagnosis not present

## 2022-03-13 LAB — POCT URINALYSIS DIP (CLINITEK)
Bilirubin, UA: NEGATIVE
Blood, UA: NEGATIVE
Glucose, UA: NEGATIVE mg/dL
Ketones, POC UA: NEGATIVE mg/dL
Leukocytes, UA: NEGATIVE
Nitrite, UA: NEGATIVE
POC PROTEIN,UA: NEGATIVE
Spec Grav, UA: 1.01 (ref 1.010–1.025)
Urobilinogen, UA: 0.2 E.U./dL
pH, UA: 6 (ref 5.0–8.0)

## 2022-03-13 NOTE — Progress Notes (Signed)
Subjective:  Patient ID: Melody Berger, female    DOB: 23-Dec-1960  Age: 61 y.o. MRN: [id number]  Chief Complaint  Patient presents with   Annual Exam    HPI Well Adult Physical: Patient here for a comprehensive physical exam.The patient reports no problems Do you take any herbs or supplements that were not prescribed by a doctor? no Are you taking calcium supplements? no Are you taking aspirin daily? no  Encounter for general adult medical examination without abnormal findings  Physical ("At Risk" items are starred): Patient's last physical exam was 1 year ago .  Patient is not afflicted from Stress Incontinence and Urge Incontinence  Patient wears a seat belt, has smoke detectors, has carbon monoxide detectors, , and wears sunscreen with extended sun exposure. Dental Care: biannual cleanings, brushes and flosses daily. Ophthalmology/Optometry: Annual visit.  Hearing loss: none Vision impairments: none  Pt will see GYN for pap Safe at home: yes Self breast exams: yes  Flowsheet Row Office Visit from 03/13/2022 in [facility]  PHQ-2 Total Score 0               Social Hx   Social History   Socioeconomic History   Marital status: Married    Spouse name: Not on file   Number of children: Not on file   Years of education: Not on file   Highest education level: Not on file  Occupational History   Occupation: unemployed  Tobacco Use   Smoking status: Never   Smokeless tobacco: Never  Vaping Use   Vaping Use: Never used  Substance and Sexual Activity   Alcohol use: No   Drug use: No   Sexual activity: Not Currently    Birth control/protection: Surgical    Comment: Tubal Ligation   Other Topics Concern   Not on file  Social History Narrative   Not on file   Social Determinants of Health   Financial Resource Strain: Not on file  Food Insecurity: Not on file  Transportation Needs: Not on file  Physical Activity: Not on file  Stress: Not on file  Social  Connections: Not on file   Past Medical History:  Diagnosis Date   Hyperlipemia    Past Surgical History:  Procedure Laterality Date   TUBAL LIGATION      Family History  Problem Relation Age of Onset   Osteoporosis Mother    Cancer Father    Breast cancer Cousin        in 56's or 59's    Review of Systems  CONSTITUTIONAL: Negative for chills, fatigue, fever, unintentional weight gain and unintentional weight loss.  E/N/T: Negative for ear pain, nasal congestion and sore throat.  CARDIOVASCULAR: Negative for chest pain, dizziness, palpitations and pedal edema.  RESPIRATORY: Negative for recent cough and dyspnea.  GASTROINTESTINAL: Negative for abdominal pain, acid reflux symptoms, constipation, diarrhea, nausea and vomiting.  MSK: Negative for arthralgias and myalgias.  INTEGUMENTARY: Negative for rash.  NEUROLOGICAL: Negative for dizziness and headaches.  PSYCHIATRIC: Negative for sleep disturbance and to question depression screen.  Negative for depression, negative for anhedonia.      Objective:  PHYSICAL EXAM:   VS: BP 106/72 (BP Location: Left Arm, Patient Position: Sitting, Cuff Size: Large)   Pulse 73   Temp (!) 97.3 F (36.3 C) (Temporal)   Ht 5\' 1"  (1.549 m)   Wt 140 lb 12.8 oz (63.9 kg)   LMP 05/22/2013   SpO2 99%   BMI 26.60 kg/m  GEN: Well nourished, well developed, in no acute distress  HEENT: normal external ears and nose - normal external auditory canals and TMS - hearing grossly normal  - Lips, Teeth and Gums - normal  Oropharynx - normal mucosa, palate, and posterior pharynx Neck: no JVD or masses - no thyromegaly Cardiac: RRR; no murmurs, rubs, or gallops,no edema - no significant varicosities Respiratory:  normal respiratory rate and pattern with no distress - normal breath sounds with no rales, rhonchi, wheezes or rubs GI: normal bowel sounds, no masses or tenderness MS: no deformity or atrophy  Skin: warm and dry, no rash  Neuro:  Alert and  Oriented x 3, Strength and sensation are intact - CN II-Xii grossly intact Psych: euthymic mood, appropriate affect and demeanor Office Visit on 03/13/2022  Component Date Value Ref Range Status   Color, UA 03/13/2022 yellow  yellow Final   Clarity, UA 03/13/2022 clear  clear Final   Glucose, UA 03/13/2022 negative  negative mg/dL Final   Bilirubin, UA 16/12/3708 negative  negative Final   Ketones, POC UA 03/13/2022 negative  negative mg/dL Final   Spec Grav, UA 62/69/4854 1.010  1.010 - 1.025 Final   Blood, UA 03/13/2022 negative  negative Final   pH, UA 03/13/2022 6.0  5.0 - 8.0 Final   POC PROTEIN,UA 03/13/2022 negative  negative, trace Final   Urobilinogen, UA 03/13/2022 0.2  0.2 or 1.0 E.U./dL Final   Nitrite, UA 62/70/3500 Negative  Negative Final   Leukocytes, UA 03/13/2022 Negative  Negative Final       03/13/2022    3:21 PM 02/16/2021    8:24 AM 03/24/2020   10:00 AM  Depression screen PHQ 2/9  Decreased Interest 0 0 0  Down, Depressed, Hopeless 0 0 0  PHQ - 2 Score 0 0 0  Altered sleeping 0    Tired, decreased energy 0    Change in appetite 0    Feeling bad or failure about yourself  0    Trouble concentrating 0    Moving slowly or fidgety/restless 0    Suicidal thoughts 0    PHQ-9 Score 0    Difficult doing work/chores Not difficult at all        Lab Results  Component Value Date   WBC 6.6 06/27/2021   HGB 14.1 06/27/2021   HCT 42.6 06/27/2021   PLT 155 06/27/2021   GLUCOSE 76 08/17/2021   CHOL 183 08/17/2021   TRIG 80 08/17/2021   HDL 66 08/17/2021   LDLCALC 102 (H) 08/17/2021   ALT 20 08/17/2021   AST 23 08/17/2021   NA 140 08/17/2021   K 4.2 08/17/2021   CL 101 08/17/2021   CREATININE 0.73 08/17/2021   BUN 13 08/17/2021   CO2 24 08/17/2021      Assessment & Plan:   Problem List Items Addressed This Visit   None Visit Diagnoses     Annual physical exam    -  Primary   Relevant Orders   CBC with Differential/Platelet   Comprehensive  metabolic panel   TSH   Lipid panel   POCT URINALYSIS DIP (CLINITEK) (Completed)         Body mass index is 26.6 kg/m.   These are the goals we discussed:  Goals   None      This is a list of the screening recommended for you and due dates:  Health Maintenance  Topic Date Due   Pap Smear  04/16/2022*   Colon Cancer  Screening  02/17/2023   Mammogram  01/06/2024   Tetanus Vaccine  08/11/2029   Zoster (Shingles) Vaccine  Completed   HPV Vaccine  Aged Out   Flu Shot  Discontinued   COVID-19 Vaccine  Discontinued   Hepatitis C Screening: USPSTF Recommendation to screen - Ages 18-79 yo.  Discontinued   HIV Screening  Discontinued  *Topic was postponed. The date shown is not the original due date.     No orders of the defined types were placed in this encounter.   Follow-up: Return in about 1 year (around 03/14/2023) for fasting physical.  An After Visit Summary was printed and given to the patient.  Yetta Flock [facility] [phone]

## 2022-03-17 LAB — COMPREHENSIVE METABOLIC PANEL
ALT: 24 IU/L (ref 0–32)
AST: 28 IU/L (ref 0–40)
Albumin/Globulin Ratio: 1.5 (ref 1.2–2.2)
Albumin: 4.4 g/dL (ref 3.9–4.9)
Alkaline Phosphatase: 99 IU/L (ref 44–121)
BUN/Creatinine Ratio: 21 (ref 12–28)
BUN: 16 mg/dL (ref 8–27)
Bilirubin Total: 0.4 mg/dL (ref 0.0–1.2)
CO2: 21 mmol/L (ref 20–29)
Calcium: 9.8 mg/dL (ref 8.7–10.3)
Chloride: 100 mmol/L (ref 96–106)
Creatinine, Ser: 0.75 mg/dL (ref 0.57–1.00)
Globulin, Total: 2.9 g/dL (ref 1.5–4.5)
Glucose: 69 mg/dL — ABNORMAL LOW (ref 70–99)
Potassium: 4.1 mmol/L (ref 3.5–5.2)
Sodium: 140 mmol/L (ref 134–144)
Total Protein: 7.3 g/dL (ref 6.0–8.5)
eGFR: 91 mL/min/{1.73_m2} (ref >59)

## 2022-03-17 LAB — CBC WITH DIFFERENTIAL/PLATELET
Basophils Absolute: 0 10*3/uL (ref 0.0–0.2)
Basos: 0 %
EOS (ABSOLUTE): 0.1 10*3/uL (ref 0.0–0.4)
Eos: 1 %
Hematocrit: 43.5 % (ref 34.0–46.6)
Hemoglobin: 14.3 g/dL (ref 11.1–15.9)
Immature Grans (Abs): 0 10*3/uL (ref 0.0–0.1)
Immature Granulocytes: 0 %
Lymphocytes Absolute: 2.8 10*3/uL (ref 0.7–3.1)
Lymphs: 37 %
MCH: 28.2 pg (ref 26.6–33.0)
MCHC: 32.9 g/dL (ref 31.5–35.7)
MCV: 86 fL (ref 79–97)
Monocytes Absolute: 0.4 10*3/uL (ref 0.1–0.9)
Monocytes: 6 %
Neutrophils Absolute: 4.2 10*3/uL (ref 1.4–7.0)
Neutrophils: 56 %
Platelets: 189 10*3/uL (ref 150–450)
RBC: 5.07 x10E6/uL (ref 3.77–5.28)
RDW: 12.8 % (ref 11.7–15.4)
WBC: 7.5 10*3/uL (ref 3.4–10.8)

## 2022-03-17 LAB — TSH: TSH: 2.33 u[IU]/mL (ref 0.450–4.500)

## 2022-03-17 LAB — CARDIOVASCULAR RISK ASSESSMENT

## 2022-03-17 LAB — LIPID PANEL
Chol/HDL Ratio: 3.4 ratio (ref 0.0–4.4)
Cholesterol, Total: 222 mg/dL — ABNORMAL HIGH (ref 100–199)
HDL: 66 mg/dL (ref >39)
LDL Chol Calc (NIH): 140 mg/dL — ABNORMAL HIGH (ref 0–99)
Triglycerides: 89 mg/dL (ref 0–149)
VLDL Cholesterol Cal: 16 mg/dL (ref 5–40)

## 2022-03-22 ENCOUNTER — Other Ambulatory Visit: Payer: Self-pay | Admitting: Physician Assistant

## 2022-03-22 DIAGNOSIS — E782 Mixed hyperlipidemia: Secondary | ICD-10-CM

## 2022-03-22 MED ORDER — ROSUVASTATIN CALCIUM 10 MG PO TABS: 10.0000 mg | ORAL_TABLET | Freq: Every day | ORAL | 1 refills | Status: DC

## 2022-04-05 ENCOUNTER — Encounter: Payer: Self-pay | Admitting: Gastroenterology

## 2022-08-28 ENCOUNTER — Ambulatory Visit: Admitting: Physician Assistant

## 2022-08-28 ENCOUNTER — Encounter: Payer: Self-pay | Admitting: Physician Assistant

## 2022-08-28 VITALS — BP 106/70 | HR 72 | Temp 97.3°F | Ht 61.0 in | Wt 141.6 lb

## 2022-08-28 DIAGNOSIS — R413 Other amnesia: Secondary | ICD-10-CM | POA: Diagnosis not present

## 2022-08-28 DIAGNOSIS — D699 Hemorrhagic condition, unspecified: Secondary | ICD-10-CM | POA: Diagnosis not present

## 2022-08-28 DIAGNOSIS — Z1211 Encounter for screening for malignant neoplasm of colon: Secondary | ICD-10-CM

## 2022-08-28 DIAGNOSIS — B001 Herpesviral vesicular dermatitis: Secondary | ICD-10-CM

## 2022-08-28 DIAGNOSIS — R5383 Other fatigue: Secondary | ICD-10-CM

## 2022-08-28 MED ORDER — VALACYCLOVIR HCL 1 G PO TABS: ORAL_TABLET | ORAL | 1 refills | Status: DC

## 2022-08-28 NOTE — Progress Notes (Signed)
Established Patient Office Visit  Subjective:  Patient ID: Melody Berger, female    DOB: 23-Jul-1960  Age: 62 y.o. MRN: [id number]  CC:  Chief Complaint  Patient presents with   Fatigue    HPI Melody Berger presents for complaints of fatigue - she states for the past several weeks she has had more fatigue than usual.  She feels she is getting plenty of sleep and wakes up refreshed but as the day goes on she feels drained She states she is having no chest pain, dyspnea, abdominal pain or nausea/vomiting, no rashes, no urine symptoms, no diarrhea or constipation Pt also feels as though she is 'foggy' at times and forgets things more easily than she had done in past  Pt noted that she cut her thumb recently and it took quite a long time for it to stop - very concerned about that issue  Pt does have fever blister - requests rx for valtrex  It is noted pt is due for colonoscopy this year - recommend referral to GI  Past Medical History:  Diagnosis Date   Hyperlipemia     Past Surgical History:  Procedure Laterality Date   TUBAL LIGATION      Family History  Problem Relation Age of Onset   Osteoporosis Mother    Cancer Father    Breast cancer Cousin        in 30's or 58's    Social History   Socioeconomic History   Marital status: Married    Spouse name: Not on file   Number of children: Not on file   Years of education: Not on file   Highest education level: Not on file  Occupational History   Occupation: unemployed  Tobacco Use   Smoking status: Never   Smokeless tobacco: Never  Vaping Use   Vaping Use: Never used  Substance and Sexual Activity   Alcohol use: No   Drug use: No   Sexual activity: Not Currently    Birth control/protection: Surgical    Comment: Tubal Ligation   Other Topics Concern   Not on file  Social History Narrative   Not on file   Social Determinants of Health   Financial Resource Strain: Not on file  Food Insecurity: Not on file   Transportation Needs: Not on file  Physical Activity: Not on file  Stress: Not on file  Social Connections: Not on file  Intimate Partner Violence: Not on file     Current Outpatient Medications:    Multiple Vitamins-Minerals (MULTIVITAMIN PO), Take by mouth., Disp: , Rfl:    rosuvastatin (CRESTOR) 10 MG tablet, Take 1 tablet (10 mg total) by mouth daily., Disp: 90 tablet, Rfl: 1   valACYclovir (VALTREX) 1000 MG tablet, 2 po bid for one day for fever blister, Disp: 20 tablet, Rfl: 1   Allergies  Allergen Reactions   Terbinafine And Related    Vagisil [Anti-Itch Vaginal] Swelling    Swelling of face and vagina    ROS CONSTITUTIONAL: see HPI E/N/T: Negative for ear pain, nasal congestion and sore throat.  CARDIOVASCULAR: Negative for chest pain, dizziness, palpitations and pedal edema.  RESPIRATORY: Negative for recent cough and dyspnea.  GASTROINTESTINAL: Negative for abdominal pain, acid reflux symptoms, constipation, diarrhea, nausea and vomiting.  MSK: Negative for arthralgias and myalgias.  INTEGUMENTARY: Negative for rash.  NEUROLOGICAL: Negative for dizziness and headaches.  PSYCHIATRIC: Negative for sleep disturbance and to question depression screen.  Negative for depression, negative for anhedonia.  Objective:    PHYSICAL EXAM:   VS: BP 106/70 (BP Location: Left Arm, Patient Position: Sitting, Cuff Size: Normal)   Pulse 72   Temp (!) 97.3 F (36.3 C) (Temporal)   Ht '5\' 1"'$  (1.549 m)   Wt 141 lb 9.6 oz (64.2 kg)   LMP 05/22/2013   SpO2 98%   BMI 26.76 kg/m   GEN: Well nourished, well developed, in no acute distress  HEENT: normal external ears and nose - normal external auditory canals and TMS - - Lips, Teeth and Gums - normal  Oropharynx - normal mucosa, palate, and posterior pharynx Cardiac: RRR; no murmurs, rubs, or gallops,no edema  Respiratory:  normal respiratory rate and pattern with no distress - normal breath sounds with no rales, rhonchi,  wheezes or rubs GI: normal bowel sounds, no masses or tenderness MS: no deformity or atrophy  Skin: warm and dry, - fever blister noted Psych: euthymic mood, appropriate affect and demeanor      Health Maintenance Due  Topic Date Due   PAP SMEAR-Modifier  08/27/2016    There are no preventive care reminders to display for this patient.  Lab Results  Component Value Date   TSH 2.330 03/13/2022   Lab Results  Component Value Date   WBC 7.5 03/13/2022   HGB 14.3 03/13/2022   HCT 43.5 03/13/2022   MCV 86 03/13/2022   PLT 189 03/13/2022   Lab Results  Component Value Date   NA 140 03/13/2022   K 4.1 03/13/2022   CO2 21 03/13/2022   GLUCOSE 69 (L) 03/13/2022   BUN 16 03/13/2022   CREATININE 0.75 03/13/2022   BILITOT 0.4 03/13/2022   ALKPHOS 99 03/13/2022   AST 28 03/13/2022   ALT 24 03/13/2022   PROT 7.3 03/13/2022   ALBUMIN 4.4 03/13/2022   CALCIUM 9.8 03/13/2022   EGFR 91 03/13/2022   Lab Results  Component Value Date   CHOL 222 (H) 03/13/2022   Lab Results  Component Value Date   HDL 66 03/13/2022   Lab Results  Component Value Date   LDLCALC 140 (H) 03/13/2022   Lab Results  Component Value Date   TRIG 89 03/13/2022   Lab Results  Component Value Date   CHOLHDL 3.4 03/13/2022   No results found for: "HGBA1C"    Assessment & Plan:   Problem List Items Addressed This Visit   None Visit Diagnoses     Other fatigue    -  Primary   Relevant Orders   CBC with Differential/Platelet   Comprehensive metabolic panel   TSH   VITAMIN D 25 Hydroxy (Vit-D Deficiency, Fractures)   B12 and Folate Panel   PT AND PTT   Bleeds easily (HCC)       Relevant Orders   CBC with Differential/Platelet   PT AND PTT   Memory change       Relevant Orders   B12 and Folate Panel   Fever blister       Relevant Medications   valACYclovir (VALTREX) 1000 MG tablet   Colon cancer screening       Relevant Orders   Ambulatory referral to Gastroenterology        Meds ordered this encounter  Medications   valACYclovir (VALTREX) 1000 MG tablet    Sig: 2 po bid for one day for fever blister    Dispense:  20 tablet    Refill:  1    Order Specific Question:   Supervising Provider  AnswerRochel Brome [id number]    Follow-up: Return if symptoms worsen or fail to improve.    SARA R Eulan Heyward, PA-C

## 2022-08-30 ENCOUNTER — Other Ambulatory Visit: Payer: Self-pay | Admitting: Physician Assistant

## 2022-08-30 DIAGNOSIS — E559 Vitamin D deficiency, unspecified: Secondary | ICD-10-CM

## 2022-08-30 LAB — COMPREHENSIVE METABOLIC PANEL
ALT: 12 IU/L (ref 0–32)
AST: 19 IU/L (ref 0–40)
Albumin/Globulin Ratio: 1.6 (ref 1.2–2.2)
Albumin: 4.5 g/dL (ref 3.9–4.9)
Alkaline Phosphatase: 98 IU/L (ref 44–121)
BUN/Creatinine Ratio: 21 (ref 12–28)
BUN: 16 mg/dL (ref 8–27)
Bilirubin Total: 0.3 mg/dL (ref 0.0–1.2)
CO2: 24 mmol/L (ref 20–29)
Calcium: 9.6 mg/dL (ref 8.7–10.3)
Chloride: 101 mmol/L (ref 96–106)
Creatinine, Ser: 0.77 mg/dL (ref 0.57–1.00)
Globulin, Total: 2.8 g/dL (ref 1.5–4.5)
Glucose: 88 mg/dL (ref 70–99)
Potassium: 4.4 mmol/L (ref 3.5–5.2)
Sodium: 140 mmol/L (ref 134–144)
Total Protein: 7.3 g/dL (ref 6.0–8.5)
eGFR: 88 mL/min/{1.73_m2} (ref >59)

## 2022-08-30 LAB — CBC WITH DIFFERENTIAL/PLATELET
Basophils Absolute: 0 10*3/uL (ref 0.0–0.2)
Basos: 1 %
EOS (ABSOLUTE): 0.1 10*3/uL (ref 0.0–0.4)
Eos: 1 %
Hematocrit: 44.5 % (ref 34.0–46.6)
Hemoglobin: 14.8 g/dL (ref 11.1–15.9)
Immature Grans (Abs): 0 10*3/uL (ref 0.0–0.1)
Immature Granulocytes: 0 %
Lymphocytes Absolute: 2.6 10*3/uL (ref 0.7–3.1)
Lymphs: 40 %
MCH: 29.5 pg (ref 26.6–33.0)
MCHC: 33.3 g/dL (ref 31.5–35.7)
MCV: 89 fL (ref 79–97)
Monocytes Absolute: 0.4 10*3/uL (ref 0.1–0.9)
Monocytes: 6 %
Neutrophils Absolute: 3.4 10*3/uL (ref 1.4–7.0)
Neutrophils: 52 %
Platelets: 165 10*3/uL (ref 150–450)
RBC: 5.02 x10E6/uL (ref 3.77–5.28)
RDW: 13 % (ref 11.7–15.4)
WBC: 6.5 10*3/uL (ref 3.4–10.8)

## 2022-08-30 LAB — VITAMIN D 25 HYDROXY (VIT D DEFICIENCY, FRACTURES): Vit D, 25-Hydroxy: 23.7 ng/mL — ABNORMAL LOW (ref 30.0–100.0)

## 2022-08-30 LAB — B12 AND FOLATE PANEL
Folate: 10 ng/mL (ref >3.0)
Vitamin B-12: 425 pg/mL (ref 232–1245)

## 2022-08-30 LAB — TSH: TSH: 2.26 u[IU]/mL (ref 0.450–4.500)

## 2022-08-30 LAB — PT AND PTT
INR: 0.9 (ref 0.9–1.2)
Prothrombin Time: 10 s (ref 9.1–12.0)
aPTT: 28 s (ref 24–33)

## 2022-08-30 MED ORDER — VITAMIN D (ERGOCALCIFEROL) 1.25 MG (50000 UNIT) PO CAPS: 50000.0000 [IU] | ORAL_CAPSULE | ORAL | 5 refills | Status: DC

## 2022-09-20 ENCOUNTER — Encounter: Payer: Self-pay | Admitting: Gastroenterology

## 2022-10-03 ENCOUNTER — Other Ambulatory Visit: Payer: Self-pay | Admitting: Physician Assistant

## 2022-10-03 DIAGNOSIS — E782 Mixed hyperlipidemia: Secondary | ICD-10-CM

## 2022-11-09 ENCOUNTER — Ambulatory Visit (AMBULATORY_SURGERY_CENTER): Admitting: *Deleted

## 2022-11-09 VITALS — Ht 61.0 in | Wt 141.0 lb

## 2022-11-09 DIAGNOSIS — Z1211 Encounter for screening for malignant neoplasm of colon: Secondary | ICD-10-CM

## 2022-11-09 MED ORDER — PEG 3350-KCL-NA BICARB-NACL 420 G PO SOLR: 4000.0000 mL | Freq: Once | ORAL | 0 refills | Status: AC

## 2022-11-09 NOTE — Progress Notes (Signed)
Pt's name and DOB verified at the beginning of the pre-visit.  Pt denies any difficulty with ambulating,sitting, laying down or rolling side to side Gave both [facility] main # and MD on call # prior to instructions.  No egg or soy allergy known to patient  Patient denies ever being intubated Pt has no issues moving head neck or swallowing No FH of Malignant Hyperthermia Pt is not on diet pills Pt is not on home 02  Pt is not on blood thinners  Pt denies issues with constipation  Pt is not on dialysis Pt denise any abnormal heart rhythms  Pt denies any upcoming cardiac testing Pt encouraged to use to use Singlecare or Goodrx to reduce cost  Patient's chart reviewed by Cathlyn Parsons CNRA prior to pre-visit and patient appropriate for the [facility].  Pre-visit completed and red dot placed by patient's name on their procedure day (on provider's schedule).  . Visit by phone Pt states weight is 141 lb Instructed pt why it is important to and  to call if they have any changes in health or new medications. Directed them to the # given and on instructions.   Pt states they will.  Instructions reviewed with pt and pt states understanding. Instructed to review again prior to procedure. Pt states they will.  Instructions sent by mail with coupon and by my chart

## 2022-11-26 ENCOUNTER — Other Ambulatory Visit: Payer: Self-pay | Admitting: Physician Assistant

## 2022-11-26 DIAGNOSIS — Z1231 Encounter for screening mammogram for malignant neoplasm of breast: Secondary | ICD-10-CM

## 2022-11-27 ENCOUNTER — Encounter: Payer: Self-pay | Admitting: Certified Registered Nurse Anesthetist

## 2022-12-03 ENCOUNTER — Encounter: Payer: Self-pay | Admitting: Gastroenterology

## 2022-12-03 ENCOUNTER — Ambulatory Visit (AMBULATORY_SURGERY_CENTER): Admitting: Gastroenterology

## 2022-12-03 VITALS — BP 122/78 | HR 58 | Temp 97.8°F | Resp 16 | Ht 61.0 in | Wt 141.0 lb

## 2022-12-03 DIAGNOSIS — Z1211 Encounter for screening for malignant neoplasm of colon: Secondary | ICD-10-CM

## 2022-12-03 MED ORDER — HYDROCORTISONE ACETATE 25 MG RE SUPP: 25.0000 mg | Freq: Two times a day (BID) | RECTAL | 0 refills | Status: DC

## 2022-12-03 NOTE — Patient Instructions (Signed)
YOU HAD AN ENDOSCOPIC PROCEDURE TODAY AT THE [facility]:   Refer to the procedure report that was given to you for any specific questions about what was found during the examination.  If the procedure report does not answer your questions, please call your gastroenterologist to clarify.  If you requested that your care partner not be given the details of your procedure findings, then the procedure report has been included in a sealed envelope for you to review at your convenience later.  YOU SHOULD EXPECT: Some feelings of bloating in the abdomen. Passage of more gas than usual.  Walking can help get rid of the air that was put into your GI tract during the procedure and reduce the bloating. If you had a lower endoscopy (such as a colonoscopy or flexible sigmoidoscopy) you may notice spotting of blood in your stool or on the toilet paper. If you underwent a bowel prep for your procedure, you may not have a normal bowel movement for a few days.  Please Note:  You might notice some irritation and congestion in your nose or some drainage.  This is from the oxygen used during your procedure.  There is no need for concern and it should clear up in a day or so.  SYMPTOMS TO REPORT IMMEDIATELY:  Following lower endoscopy (colonoscopy or flexible sigmoidoscopy):  Excessive amounts of blood in the stool  Significant tenderness or worsening of abdominal pains  Swelling of the abdomen that is new, acute  Fever of 100F or higher  For urgent or emergent issues, a gastroenterologist can be reached at any hour by calling [phone]. Do not use MyChart messaging for urgent concerns.    DIET:  We do recommend a small meal at first, but then you may proceed to your regular diet.  Drink plenty of fluids but you should avoid alcoholic beverages for 24 hours.  MEDICATIONS: Continue present medications, Use HC suppositories one, twice daily for 10 days (4 refills). Also start Benefiber 1 tablespoon  by mouth twice daily with 8 ounces of water.  FOLLOW UP: Repeat colonoscopy in 10 years for screening purposes. Earlier, if with any new problems or change in family history. If you have any problems with hemorroids, Dr. Chales Abrahams recommends hemorrhoidal banding with Dr. Barron Alvine in the GI office.  Please see handouts given to you by your recovery nurse: hemorroids, high fiber diet.  Thank you for allowing [place] to provide for your healthcare needs today.  ACTIVITY:  You should plan to take it easy for the rest of today and you should NOT DRIVE or use heavy machinery until tomorrow (because of the sedation medicines used during the test).    FOLLOW UP: Our staff will call the number listed on your records the next business day following your procedure.  We will call around 7:15- 8:00 am to check on you and address any questions or concerns that you may have regarding the information given to you following your procedure. If we do not reach you, we will leave a message.     If any biopsies were taken you will be contacted by phone or by letter within the next 1-3 weeks.  Please call us at [phone] if you have not heard about the biopsies in 3 weeks.    SIGNATURES/CONFIDENTIALITY: You and/or your care partner have signed paperwork which will be entered into your electronic medical record.  These signatures attest to the fact that that the information above on your After  Visit Summary has been reviewed and is understood.  Full responsibility of the confidentiality of this discharge information lies with you and/or your care-partner. 

## 2022-12-03 NOTE — Op Note (Signed)
[facility] Patient Name: Melody Berger Procedure Date: 12/03/2022 10:29 AM MRN: [id number] Endoscopist: Lynann Bologna , MD, [id number] Age: 62 Referring MD:  Date of Birth: 06-18-1961 Gender: Female Account #: [id number] Procedure:                Colonoscopy Indications:              Screening for colorectal malignant neoplasm Medicines:                Monitored Anesthesia Care Procedure:                Pre-Anesthesia Assessment:                           - Prior to the procedure, a History and Physical                            was performed, and patient medications and                            allergies were reviewed. The patient's tolerance of                            previous anesthesia was also reviewed. The risks                            and benefits of the procedure and the sedation                            options and risks were discussed with the patient.                            All questions were answered, and informed consent                            was obtained. Prior Anticoagulants: The patient has                            taken no anticoagulant or antiplatelet agents. ASA                            Grade Assessment: II - A patient with mild systemic                            disease. After reviewing the risks and benefits,                            the patient was deemed in satisfactory condition to                            undergo the procedure.                           After obtaining informed consent, the colonoscope  was passed under direct vision. Throughout the                            procedure, the patient's blood pressure, pulse, and                            oxygen saturations were monitored continuously. The                            Olympus PCF-H190DL ([id number]) Colonoscope was                            introduced through the anus and advanced to the 2                            cm into the ileum.  The colonoscopy was performed                            without difficulty. The patient tolerated the                            procedure well. The quality of the bowel                            preparation was good. The terminal ileum, ileocecal                            valve, appendiceal orifice, and rectum were                            photographed. Scope In: 10:41:05 AM Scope Out: 10:55:50 AM Scope Withdrawal Time: 0 hours 9 minutes 50 seconds  Total Procedure Duration: 0 hours 14 minutes 45 seconds  Findings:                 The colon (entire examined portion) appeared normal.                           Non-bleeding internal hemorrhoids were found during                            retroflexion. The hemorrhoids were moderate and                            Grade I (internal hemorrhoids that do not                            prolapse). One with red wale markings suggestive of                            recent bleeding. No active bleeding                           The terminal ileum appeared normal.  The exam was otherwise without abnormality on                            direct and retroflexion views. Complications:            No immediate complications. Estimated Blood Loss:     Estimated blood loss: none. Impression:               - Mod internal hemorrhoids.                           - Otherwise normal colonoscopy to TI.                           - No specimens collected. Recommendation:           - Patient has a contact number available for                            emergencies. The signs and symptoms of potential                            delayed complications were discussed with the                            patient. Return to normal activities tomorrow.                            Written discharge instructions were provided to the                            patient.                           - High fiber diet. Start Benefiber 1 TBS p.o. daily                             with 8 ounces of water.                           - HC suppositories 1bid x 10 days 4RF                           - If any problems with hemorrhoids, recommend                            hemorrhoidal banding with Dr. Barron Alvine                           - Continue present medications.                           - Repeat colonoscopy in 10 years for screening                            purposes. Earlier, if with any new problems or  change in family history.                           - The findings and recommendations were discussed                            with the patient's family. Lynann Bologna, MD 12/03/2022 11:00:17 AM This report has been signed electronically.

## 2022-12-03 NOTE — Progress Notes (Signed)
Report given to PACU, vss 

## 2022-12-03 NOTE — Progress Notes (Signed)
Called to room to assist during endoscopic procedure.  Patient ID and intended procedure confirmed with present staff. Received instructions for my participation in the procedure from the performing physician.  Female staff member present for the entire case.  

## 2022-12-03 NOTE — Progress Notes (Signed)
[facility] History and Physical   Primary Care Physician:  Marianne Sofia, PA-C   Reason for Procedure:   CRC screening  Plan:    colon     HPI: Melody Berger is a 62 y.o. female    Past Medical History:  Diagnosis Date   Hyperlipemia     Past Surgical History:  Procedure Laterality Date   TUBAL LIGATION      Prior to Admission medications   Medication Sig Start Date End Date Taking? Authorizing Provider  hydrocortisone (ANUSOL-HC) 25 MG suppository Place 1 suppository (25 mg total) rectally 2 (two) times daily for 10 days. 12/03/22 12/13/22 Yes Lynann Bologna, MD  Multiple Vitamins-Minerals (MULTIVITAMIN PO) Take by mouth.   Yes [provider]  rosuvastatin (CRESTOR) 10 MG tablet TAKE 1 TABLET(10 MG) BY MOUTH DAILY 10/03/22  Yes Marianne Sofia, PA-C  Vitamin D, Ergocalciferol, (DRISDOL) 1.25 MG (50000 UNIT) CAPS capsule Take 1 capsule (50,000 Units total) by mouth every 7 (seven) days. 08/30/22  Yes Marianne Sofia, PA-C  valACYclovir (VALTREX) 1000 MG tablet 2 po bid for one day for fever blister 08/28/22   Marianne Sofia, PA-C    Current Outpatient Medications  Medication Sig Dispense Refill   hydrocortisone (ANUSOL-HC) 25 MG suppository Place 1 suppository (25 mg total) rectally 2 (two) times daily for 10 days. 20 suppository 0   Multiple Vitamins-Minerals (MULTIVITAMIN PO) Take by mouth.     rosuvastatin (CRESTOR) 10 MG tablet TAKE 1 TABLET(10 MG) BY MOUTH DAILY 90 tablet 1   Vitamin D, Ergocalciferol, (DRISDOL) 1.25 MG (50000 UNIT) CAPS capsule Take 1 capsule (50,000 Units total) by mouth every 7 (seven) days. 5 capsule 5   valACYclovir (VALTREX) 1000 MG tablet 2 po bid for one day for fever blister 20 tablet 1   No current facility-administered medications for this visit.    Allergies as of 12/03/2022 - Review Complete 12/03/2022  Allergen Reaction Noted   Terbinafine and related  08/12/2019   Vagisil [anti-itch vaginal] Swelling 10/02/2021    Family  History  Problem Relation Age of Onset   Osteoporosis Mother    Cancer Father    Breast cancer Cousin        in 72's or 67's   Colon polyps Neg Hx    Colon cancer Neg Hx    Esophageal cancer Neg Hx    Stomach cancer Neg Hx    Rectal cancer Neg Hx     Social History   Socioeconomic History   Marital status: Married    Spouse name: Not on file   Number of children: Not on file   Years of education: Not on file   Highest education level: Not on file  Occupational History   Occupation: unemployed  Tobacco Use   Smoking status: Never   Smokeless tobacco: Never  Vaping Use   Vaping Use: Never used  Substance and Sexual Activity   Alcohol use: No   Drug use: No   Sexual activity: Not Currently    Birth control/protection: Surgical    Comment: Tubal Ligation   Other Topics Concern   Not on file  Social History Narrative   Not on file   Social Determinants of Health   Financial Resource Strain: Not on file  Food Insecurity: Not on file  Transportation Needs: Not on file  Physical Activity: Not on file  Stress: Not on file  Social Connections: Not on file  Intimate Partner Violence: Not on file    Review of Systems:  Positive for none All other review of systems negative except as mentioned in the HPI.  Physical Exam: Vital signs in last 24 hours: @VSRANGES @   General:   Alert,  Well-developed, well-nourished, pleasant and cooperative in NAD Lungs:  Clear throughout to auscultation.   Heart:  Regular rate and rhythm; no murmurs, clicks, rubs,  or gallops. Abdomen:  Soft, nontender and nondistended. Normal bowel sounds.   Neuro/Psych:  Alert and cooperative. Normal mood and affect. A and O x 3    No significant changes were identified.  The patient continues to be an appropriate candidate for the planned procedure and anesthesia.   Edman Circle, MD. [facility] 12/03/2022 3:06 PM@

## 2022-12-03 NOTE — Progress Notes (Signed)
Pt's states no medical or surgical changes since previsit or office visit. 

## 2022-12-04 ENCOUNTER — Telehealth: Payer: Self-pay

## 2022-12-04 NOTE — Telephone Encounter (Signed)
  Follow up Call-     12/03/2022    9:45 AM  Call back number  Post procedure Call Back phone  # [phone]  Permission to leave phone message Yes     Patient questions:  Do you have a fever, pain , or abdominal swelling? No. Pain Score  0 *  Have you tolerated food without any problems? Yes.    Have you been able to return to your normal activities? Yes.    Do you have any questions about your discharge instructions: Diet   No. Medications  No. Follow up visit  No.  Do you have questions or concerns about your Care? No.  Actions: * If pain score is 4 or above: No action needed, pain <4.

## 2023-01-04 ENCOUNTER — Ambulatory Visit
Admission: RE | Admit: 2023-01-04 | Discharge: 2023-01-04 | Disposition: A | Discharge status: Home / Self Care | Source: Ambulatory Visit | Attending: Physician Assistant | Admitting: Physician Assistant

## 2023-01-04 DIAGNOSIS — Z1231 Encounter for screening mammogram for malignant neoplasm of breast: Secondary | ICD-10-CM

## 2023-03-18 ENCOUNTER — Encounter: Admitting: Physician Assistant

## 2023-03-20 ENCOUNTER — Encounter: Admitting: Physician Assistant

## 2023-06-15 ENCOUNTER — Other Ambulatory Visit: Payer: Self-pay | Admitting: Physician Assistant

## 2023-06-15 DIAGNOSIS — E782 Mixed hyperlipidemia: Secondary | ICD-10-CM

## 2023-07-05 ENCOUNTER — Other Ambulatory Visit: Payer: Self-pay | Admitting: Physician Assistant

## 2023-07-05 DIAGNOSIS — E782 Mixed hyperlipidemia: Secondary | ICD-10-CM

## 2023-07-09 ENCOUNTER — Telehealth: Payer: Self-pay | Admitting: Physician Assistant

## 2023-07-09 NOTE — Telephone Encounter (Signed)
Prescription Request  07/09/2023  LOV: 08/28/2022  What is the name of the medication or equipment? rosuvastatin (CRESTOR) 10 MG tablet   Have you contacted your pharmacy to request a refill? No   Which pharmacy would you like this sent to?  [id number] Rosalita Levan, [place] - [address] AT [facility] [address] [facility] [address] [place] [address] Phone: [phone] Fax: [phone]    Patient notified that their request is being sent to the clinical staff for review and that they should receive a response within 2 business days.   Please advise at [facility] [phone]

## 2023-08-13 ENCOUNTER — Other Ambulatory Visit: Payer: Self-pay | Admitting: Family Medicine

## 2023-08-13 DIAGNOSIS — E782 Mixed hyperlipidemia: Secondary | ICD-10-CM

## 2023-08-29 ENCOUNTER — Other Ambulatory Visit: Payer: Self-pay | Admitting: Family Medicine

## 2023-08-29 DIAGNOSIS — E782 Mixed hyperlipidemia: Secondary | ICD-10-CM

## 2023-09-04 ENCOUNTER — Ambulatory Visit (INDEPENDENT_AMBULATORY_CARE_PROVIDER_SITE_OTHER): Admitting: Physician Assistant

## 2023-09-04 ENCOUNTER — Encounter: Payer: Self-pay | Admitting: Physician Assistant

## 2023-09-04 VITALS — BP 106/70 | HR 67 | Temp 98.2°F | Ht 61.0 in | Wt 145.6 lb

## 2023-09-04 DIAGNOSIS — E559 Vitamin D deficiency, unspecified: Secondary | ICD-10-CM

## 2023-09-04 DIAGNOSIS — E782 Mixed hyperlipidemia: Secondary | ICD-10-CM | POA: Diagnosis not present

## 2023-09-04 DIAGNOSIS — H6502 Acute serous otitis media, left ear: Secondary | ICD-10-CM | POA: Diagnosis not present

## 2023-09-04 DIAGNOSIS — J060 Acute laryngopharyngitis: Secondary | ICD-10-CM | POA: Diagnosis not present

## 2023-09-04 MED ORDER — FLUTICASONE PROPIONATE 50 MCG/ACT NA SUSP: 2.0000 | Freq: Every day | NASAL | 6 refills | Status: DC

## 2023-09-04 MED ORDER — ROSUVASTATIN CALCIUM 10 MG PO TABS: 10.0000 mg | ORAL_TABLET | Freq: Every day | ORAL | 1 refills | Status: DC

## 2023-09-04 MED ORDER — AMOXICILLIN 875 MG PO TABS: 875.0000 mg | ORAL_TABLET | Freq: Two times a day (BID) | ORAL | 0 refills | Status: DC

## 2023-09-04 NOTE — Progress Notes (Signed)
 Acute Office Visit  Subjective:    Patient ID: Melody Berger, female    DOB: [birth year], 63 y.o.   MRN: [id number]  Chief Complaint  Patient presents with   Ear Pain    Left ear painful, right ear itching    HPI: Patient is in today for complaints of both ears feeling full and stopped up - left ear with pain and right ear is itchy Is having nasal congestion in mornings but denies fever or malaise  Pt currently on crestor 10mg  qd - is overdue for labwork Does try to watch diet  Pt with vit D def - taking weekly supplement - is due for labwork   Current Outpatient Medications:    fluticasone (FLONASE) 50 MCG/ACT nasal spray, Place 2 sprays into both nostrils daily., Disp: 16 g, Rfl: 6   Multiple Vitamins-Minerals (MULTIVITAMIN PO), Take by mouth., Disp: , Rfl:    valACYclovir (VALTREX) 1000 MG tablet, 2 po bid for one day for fever blister, Disp: 20 tablet, Rfl: 1   Vitamin D, Ergocalciferol, (DRISDOL) 1.25 MG (50000 UNIT) CAPS capsule, Take 1 capsule (50,000 Units total) by mouth every 7 (seven) days., Disp: 5 capsule, Rfl: 5   amoxicillin (AMOXIL) 875 MG tablet, Take 1 tablet (875 mg total) by mouth 2 (two) times daily., Disp: 20 tablet, Rfl: 0   rosuvastatin (CRESTOR) 10 MG tablet, Take 1 tablet (10 mg total) by mouth daily., Disp: 90 tablet, Rfl: 1  Allergies  Allergen Reactions   Terbinafine And Related    Vagisil [Anti-Itch Vaginal] Swelling    Swelling of face and vagina    ROS CONSTITUTIONAL: Negative for chills, fatigue, fever,  E/N/T: see HPI CARDIOVASCULAR: Negative for chest pain, dizziness, palpitations and pedal edema.  RESPIRATORY: Negative for recent cough and dyspnea.  GASTROINTESTINAL: Negative for abdominal pain, acid reflux symptoms, constipation, diarrhea, nausea and vomiting.  MSK: Negative for arthralgias and myalgias.  INTEGUMENTARY: Negative for rash.   PSYCHIATRIC: Negative for sleep disturbance and to question depression screen.  Negative for  depression, negative for anhedonia.      Objective:    PHYSICAL EXAM:   BP 106/70   Pulse 67   Temp 98.2 F (36.8 C)   Ht 5\' 1"  (1.549 m)   Wt 145 lb 9.6 oz (66 kg)   LMP 05/22/2013   SpO2 97%   BMI 27.51 kg/m    GEN: Well nourished, well developed, in no acute distress  HEENT: both TMS retracted - left TM with mild erythema Oropharynx - mild pnd  Cardiac: RRR; no murmurs, rubs, or gallops,no edema - Respiratory:  normal respiratory rate and pattern with no distress - normal breath sounds with no rales, rhonchi, wheezes or rubs  Skin: warm and dry, no rash  Neuro:  Alert and Oriented x 3,- CN II-Xii grossly intact Psych: euthymic mood, appropriate affect and demeanor     Assessment & Plan:    Vitamin D insufficiency -     VITAMIN D 25 Hydroxy (Vit-D Deficiency, Fractures)  Mixed hyperlipidemia -     Comprehensive metabolic panel -     CBC with Differential/Platelet -     Lipid panel -     Rosuvastatin Calcium; Take 1 tablet (10 mg total) by mouth daily.  Dispense: 90 tablet; Refill: 1  Non-recurrent acute serous otitis media of left ear -     Amoxicillin; Take 1 tablet (875 mg total) by mouth 2 (two) times daily.  Dispense: 20 tablet; Refill: 0  Acute  laryngopharyngitis -     Fluticasone Propionate; Place 2 sprays into both nostrils daily.  Dispense: 16 g; Refill: 6     Follow-up: Return in about 6 months (around 03/06/2024), or if symptoms worsen or fail to improve, for chronic fasting follow-up.  An After Visit Summary was printed and given to the patient.  Jettie Pagan [facility] [phone]

## 2023-09-05 LAB — LIPID PANEL
Chol/HDL Ratio: 3.6 ratio (ref 0.0–4.4)
Cholesterol, Total: 212 mg/dL — ABNORMAL HIGH (ref 100–199)
HDL: 59 mg/dL (ref >39)
LDL Chol Calc (NIH): 134 mg/dL — ABNORMAL HIGH (ref 0–99)
Triglycerides: 105 mg/dL (ref 0–149)
VLDL Cholesterol Cal: 19 mg/dL (ref 5–40)

## 2023-09-05 LAB — VITAMIN D 25 HYDROXY (VIT D DEFICIENCY, FRACTURES): Vit D, 25-Hydroxy: 49.2 ng/mL (ref 30.0–100.0)

## 2023-09-05 LAB — COMPREHENSIVE METABOLIC PANEL
ALT: 24 IU/L (ref 0–32)
AST: 22 IU/L (ref 0–40)
Albumin: 4.4 g/dL (ref 3.9–4.9)
Alkaline Phosphatase: 104 IU/L (ref 44–121)
BUN/Creatinine Ratio: 17 (ref 12–28)
BUN: 12 mg/dL (ref 8–27)
Bilirubin Total: 0.6 mg/dL (ref 0.0–1.2)
CO2: 24 mmol/L (ref 20–29)
Calcium: 9.8 mg/dL (ref 8.7–10.3)
Chloride: 105 mmol/L (ref 96–106)
Creatinine, Ser: 0.7 mg/dL (ref 0.57–1.00)
Globulin, Total: 2.8 g/dL (ref 1.5–4.5)
Glucose: 80 mg/dL (ref 70–99)
Potassium: 4.5 mmol/L (ref 3.5–5.2)
Sodium: 142 mmol/L (ref 134–144)
Total Protein: 7.2 g/dL (ref 6.0–8.5)
eGFR: 98 mL/min/{1.73_m2} (ref >59)

## 2023-09-05 LAB — CBC WITH DIFFERENTIAL/PLATELET
Basophils Absolute: 0 10*3/uL (ref 0.0–0.2)
Basos: 1 %
EOS (ABSOLUTE): 0.1 10*3/uL (ref 0.0–0.4)
Eos: 1 %
Hematocrit: 43.9 % (ref 34.0–46.6)
Hemoglobin: 14.6 g/dL (ref 11.1–15.9)
Immature Grans (Abs): 0 10*3/uL (ref 0.0–0.1)
Immature Granulocytes: 0 %
Lymphocytes Absolute: 2.1 10*3/uL (ref 0.7–3.1)
Lymphs: 28 %
MCH: 29.5 pg (ref 26.6–33.0)
MCHC: 33.3 g/dL (ref 31.5–35.7)
MCV: 89 fL (ref 79–97)
Monocytes Absolute: 0.5 10*3/uL (ref 0.1–0.9)
Monocytes: 6 %
Neutrophils Absolute: 4.8 10*3/uL (ref 1.4–7.0)
Neutrophils: 64 %
Platelets: 199 10*3/uL (ref 150–450)
RBC: 4.95 x10E6/uL (ref 3.77–5.28)
RDW: 13 % (ref 11.7–15.4)
WBC: 7.4 10*3/uL (ref 3.4–10.8)

## 2023-09-27 ENCOUNTER — Other Ambulatory Visit: Payer: Self-pay | Admitting: Physician Assistant

## 2023-09-27 DIAGNOSIS — E559 Vitamin D deficiency, unspecified: Secondary | ICD-10-CM

## 2023-12-23 ENCOUNTER — Other Ambulatory Visit: Payer: Self-pay | Admitting: Physician Assistant

## 2023-12-23 DIAGNOSIS — Z1231 Encounter for screening mammogram for malignant neoplasm of breast: Secondary | ICD-10-CM

## 2024-01-06 ENCOUNTER — Ambulatory Visit
Admission: RE | Admit: 2024-01-06 | Discharge: 2024-01-06 | Disposition: A | Discharge status: Home / Self Care | Source: Ambulatory Visit | Attending: Physician Assistant | Admitting: Physician Assistant

## 2024-01-06 DIAGNOSIS — Z1231 Encounter for screening mammogram for malignant neoplasm of breast: Secondary | ICD-10-CM

## 2024-03-06 ENCOUNTER — Encounter: Payer: Self-pay | Admitting: Physician Assistant

## 2024-03-06 ENCOUNTER — Ambulatory Visit: Admitting: Physician Assistant

## 2024-03-06 VITALS — BP 104/76 | HR 60 | Temp 98.3°F | Ht 61.0 in | Wt 145.4 lb

## 2024-03-06 DIAGNOSIS — K649 Unspecified hemorrhoids: Secondary | ICD-10-CM | POA: Diagnosis not present

## 2024-03-06 DIAGNOSIS — E559 Vitamin D deficiency, unspecified: Secondary | ICD-10-CM | POA: Diagnosis not present

## 2024-03-06 DIAGNOSIS — E782 Mixed hyperlipidemia: Secondary | ICD-10-CM | POA: Diagnosis not present

## 2024-03-06 MED ORDER — ROSUVASTATIN CALCIUM 10 MG PO TABS: 10.0000 mg | ORAL_TABLET | Freq: Every day | ORAL | 1 refills | Status: AC

## 2024-03-06 MED ORDER — HYDROCORTISONE ACETATE 25 MG RE SUPP: 25.0000 mg | Freq: Two times a day (BID) | RECTAL | 0 refills | Status: AC

## 2024-03-06 MED ORDER — VITAMIN D (ERGOCALCIFEROL) 1.25 MG (50000 UNIT) PO CAPS: 50000.0000 [IU] | ORAL_CAPSULE | ORAL | 1 refills | Status: AC

## 2024-03-06 NOTE — Progress Notes (Signed)
   Acute Office Visit  Subjective:    Patient ID: Melody Berger, female    DOB: Jul 17, 1960, 63 y.o.   MRN: [id number]  Chief Complaint  Patient presents with   Medical Management of Chronic Issues    HPI  Pt currently on crestor  10mg  qd - is due for labwork Does try to watch diet  Pt with vit D def - taking weekly supplement - is due for labwork  Pt has issues with hemorrhoids and requests refill of suppositories that GI has given her in the past  Current Outpatient Medications:    Multiple Vitamins-Minerals (MULTIVITAMIN PO), Take by mouth., Disp: , Rfl:    hydrocortisone  (ANUSOL -HC) 25 MG suppository, Place 1 suppository (25 mg total) rectally 2 (two) times daily for 10 days., Disp: 20 suppository, Rfl: 0   rosuvastatin  (CRESTOR ) 10 MG tablet, Take 1 tablet (10 mg total) by mouth daily., Disp: 90 tablet, Rfl: 1   Vitamin D , Ergocalciferol , (DRISDOL ) 1.25 MG (50000 UNIT) CAPS capsule, Take 1 capsule (50,000 Units total) by mouth every 7 (seven) days., Disp: 12 capsule, Rfl: 1  Allergies  Allergen Reactions   Terbinafine And Related    Vagisil [Anti-Itch Vaginal] Swelling    Swelling of face and vagina    CONSTITUTIONAL: Negative for chills, fatigue, fever, unintentional weight gain and unintentional weight loss.  CARDIOVASCULAR: Negative for chest pain, dizziness, palpitations and pedal edema.  RESPIRATORY: Negative for recent cough and dyspnea.  GASTROINTESTINAL: see HPI MSK: Negative for arthralgias and myalgias.  INTEGUMENTARY: Negative for rash.  PSYCHIATRIC: Negative for sleep disturbance and to question depression screen.  Negative for depression, negative for anhedonia.         Objective:    PHYSICAL EXAM:   VS: BP 104/76   Pulse 60   Temp 98.3 F (36.8 C)   Ht 5' 1 (1.549 m)   Wt 145 lb 6.4 oz (66 kg)   LMP 05/22/2013   SpO2 96%   BMI 27.47 kg/m   GEN: Well nourished, well developed, in no acute distress   Cardiac: RRR; no murmurs, rubs, or gallops,no  edema - Respiratory:  normal respiratory rate and pattern with no distress - normal breath sounds with no rales, rhonchi, wheezes or rubs  MS: no deformity or atrophy  Skin: warm and dry, no rash  Neuro:  Alert and Oriented x 3,  - CN II-Xii grossly intact Psych: euthymic mood, appropriate affect and demeanor      Assessment & Plan:    Mixed hyperlipidemia -     CBC with Differential/Platelet -     Comprehensive metabolic panel with GFR -     Lipid panel -     Rosuvastatin  Calcium ; Take 1 tablet (10 mg total) by mouth daily.  Dispense: 90 tablet; Refill: 1  Vitamin D  insufficiency -     VITAMIN D  25 Hydroxy (Vit-D Deficiency, Fractures) -     Vitamin D  (Ergocalciferol ); Take 1 capsule (50,000 Units total) by mouth every 7 (seven) days.  Dispense: 12 capsule; Refill: 1  Hemorrhoids, unspecified hemorrhoid type -     Hydrocortisone  Acetate; Place 1 suppository (25 mg total) rectally 2 (two) times daily for 10 days.  Dispense: 20 suppository; Refill: 0      Follow-up: Return in about 6 months (around 09/03/2024) for fasting physical.  An After Visit Summary was printed and given to the patient.  CAMIE JONELLE NICHOLAUS DEVONNA [facility] [phone]

## 2024-03-07 LAB — COMPREHENSIVE METABOLIC PANEL WITH GFR
ALT: 32 IU/L (ref 0–32)
AST: 30 IU/L (ref 0–40)
Albumin: 4.5 g/dL (ref 3.9–4.9)
Alkaline Phosphatase: 110 IU/L (ref 49–135)
BUN/Creatinine Ratio: 16 (ref 12–28)
BUN: 13 mg/dL (ref 8–27)
Bilirubin Total: 0.6 mg/dL (ref 0.0–1.2)
CO2: 23 mmol/L (ref 20–29)
Calcium: 9.6 mg/dL (ref 8.7–10.3)
Chloride: 104 mmol/L (ref 96–106)
Creatinine, Ser: 0.81 mg/dL (ref 0.57–1.00)
Globulin, Total: 2.7 g/dL (ref 1.5–4.5)
Glucose: 92 mg/dL (ref 70–99)
Potassium: 4.6 mmol/L (ref 3.5–5.2)
Sodium: 143 mmol/L (ref 134–144)
Total Protein: 7.2 g/dL (ref 6.0–8.5)
eGFR: 82 mL/min/1.73 (ref >59)

## 2024-03-07 LAB — CBC WITH DIFFERENTIAL/PLATELET
Basophils Absolute: 0 x10E3/uL (ref 0.0–0.2)
Basos: 1 %
EOS (ABSOLUTE): 0.2 x10E3/uL (ref 0.0–0.4)
Eos: 3 %
Hematocrit: 45.5 % (ref 34.0–46.6)
Hemoglobin: 14.8 g/dL (ref 11.1–15.9)
Immature Grans (Abs): 0 x10E3/uL (ref 0.0–0.1)
Immature Granulocytes: 0 %
Lymphocytes Absolute: 2.2 x10E3/uL (ref 0.7–3.1)
Lymphs: 32 %
MCH: 29.5 pg (ref 26.6–33.0)
MCHC: 32.5 g/dL (ref 31.5–35.7)
MCV: 91 fL (ref 79–97)
Monocytes Absolute: 0.5 x10E3/uL (ref 0.1–0.9)
Monocytes: 7 %
Neutrophils Absolute: 3.8 x10E3/uL (ref 1.4–7.0)
Neutrophils: 57 %
Platelets: 202 x10E3/uL (ref 150–450)
RBC: 5.01 x10E6/uL (ref 3.77–5.28)
RDW: 13 % (ref 11.7–15.4)
WBC: 6.7 x10E3/uL (ref 3.4–10.8)

## 2024-03-07 LAB — VITAMIN D 25 HYDROXY (VIT D DEFICIENCY, FRACTURES): Vit D, 25-Hydroxy: 47.7 ng/mL (ref 30.0–100.0)

## 2024-03-07 LAB — LIPID PANEL
Chol/HDL Ratio: 2.9 ratio (ref 0.0–4.4)
Cholesterol, Total: 181 mg/dL (ref 100–199)
HDL: 63 mg/dL (ref >39)
LDL Chol Calc (NIH): 98 mg/dL (ref 0–99)
Triglycerides: 114 mg/dL (ref 0–149)
VLDL Cholesterol Cal: 20 mg/dL (ref 5–40)

## 2024-03-09 ENCOUNTER — Ambulatory Visit: Payer: Self-pay | Admitting: Physician Assistant

## 2024-03-12 ENCOUNTER — Other Ambulatory Visit: Payer: Self-pay | Admitting: Physician Assistant

## 2024-03-12 DIAGNOSIS — B001 Herpesviral vesicular dermatitis: Secondary | ICD-10-CM

## 2024-09-08 ENCOUNTER — Encounter: Admitting: Physician Assistant
